# Patient Record
Sex: Female | Born: 1960
Health system: Southern US, Community
[De-identification: ages and names within clinical notes are randomized; demographics above are authoritative.]

## PROBLEM LIST (undated history)

## (undated) DIAGNOSIS — K5790 Diverticulosis of intestine, part unspecified, without perforation or abscess without bleeding: Secondary | ICD-10-CM

## (undated) DIAGNOSIS — M1990 Unspecified osteoarthritis, unspecified site: Secondary | ICD-10-CM

## (undated) DIAGNOSIS — K635 Polyp of colon: Secondary | ICD-10-CM

## (undated) DIAGNOSIS — G44009 Cluster headache syndrome, unspecified, not intractable: Secondary | ICD-10-CM

## (undated) DIAGNOSIS — K219 Gastro-esophageal reflux disease without esophagitis: Secondary | ICD-10-CM

## (undated) DIAGNOSIS — N920 Excessive and frequent menstruation with regular cycle: Secondary | ICD-10-CM

## (undated) DIAGNOSIS — E282 Polycystic ovarian syndrome: Secondary | ICD-10-CM

## (undated) DIAGNOSIS — I700 Atherosclerosis of aorta: Secondary | ICD-10-CM

## (undated) DIAGNOSIS — Z8619 Personal history of other infectious and parasitic diseases: Secondary | ICD-10-CM

## (undated) DIAGNOSIS — G4730 Sleep apnea, unspecified: Secondary | ICD-10-CM

## (undated) DIAGNOSIS — I10 Essential (primary) hypertension: Secondary | ICD-10-CM

## (undated) DIAGNOSIS — T7840XA Allergy, unspecified, initial encounter: Secondary | ICD-10-CM

## (undated) DIAGNOSIS — F419 Anxiety disorder, unspecified: Secondary | ICD-10-CM

## (undated) DIAGNOSIS — M25561 Pain in right knee: Secondary | ICD-10-CM

## (undated) DIAGNOSIS — Z87442 Personal history of urinary calculi: Secondary | ICD-10-CM

## (undated) HISTORY — PX: CHOLECYSTECTOMY: SHX55

## (undated) HISTORY — PX: CYSTOSCOPY: SUR368

## (undated) HISTORY — DX: Pain in right knee: M25.561

## (undated) HISTORY — DX: Personal history of other infectious and parasitic diseases: Z86.19

## (undated) HISTORY — DX: Essential (primary) hypertension: I10

## (undated) HISTORY — PX: COLON SURGERY: SHX602

## (undated) HISTORY — DX: Diverticulosis of intestine, part unspecified, without perforation or abscess without bleeding: K57.90

## (undated) HISTORY — DX: Cluster headache syndrome, unspecified, not intractable: G44.009

## (undated) HISTORY — DX: Gastro-esophageal reflux disease without esophagitis: K21.9

## (undated) HISTORY — DX: Excessive and frequent menstruation with regular cycle: N92.0

## (undated) HISTORY — DX: Polyp of colon: K63.5

## (undated) HISTORY — DX: Atherosclerosis of aorta: I70.0

## (undated) HISTORY — DX: Allergy, unspecified, initial encounter: T78.40XA

## (undated) HISTORY — DX: Polycystic ovarian syndrome: E28.2

---

## 1991-03-09 HISTORY — PX: TUBAL LIGATION: SHX77

## 1999-06-10 ENCOUNTER — Other Ambulatory Visit: Admission: RE | Admit: 1999-06-10 | Discharge: 1999-06-10 | Payer: Self-pay | Admitting: Obstetrics and Gynecology

## 1999-06-23 ENCOUNTER — Encounter: Payer: Self-pay | Admitting: Internal Medicine

## 1999-06-23 ENCOUNTER — Ambulatory Visit (HOSPITAL_COMMUNITY): Admission: RE | Admit: 1999-06-23 | Discharge: 1999-06-23 | Payer: Self-pay | Admitting: Internal Medicine

## 1999-08-03 ENCOUNTER — Encounter: Payer: Self-pay | Admitting: Internal Medicine

## 1999-08-03 ENCOUNTER — Ambulatory Visit (HOSPITAL_COMMUNITY): Admission: RE | Admit: 1999-08-03 | Discharge: 1999-08-03 | Payer: Self-pay | Admitting: Internal Medicine

## 2000-03-31 ENCOUNTER — Other Ambulatory Visit: Admission: RE | Admit: 2000-03-31 | Discharge: 2000-03-31 | Payer: Self-pay | Admitting: Obstetrics and Gynecology

## 2000-06-21 ENCOUNTER — Ambulatory Visit (HOSPITAL_BASED_OUTPATIENT_CLINIC_OR_DEPARTMENT_OTHER): Admission: RE | Admit: 2000-06-21 | Discharge: 2000-06-21 | Payer: Self-pay | Admitting: Internal Medicine

## 2000-06-28 ENCOUNTER — Other Ambulatory Visit: Admission: RE | Admit: 2000-06-28 | Discharge: 2000-06-28 | Payer: Self-pay | Admitting: Obstetrics and Gynecology

## 2000-06-28 ENCOUNTER — Encounter (INDEPENDENT_AMBULATORY_CARE_PROVIDER_SITE_OTHER): Payer: Self-pay | Admitting: Specialist

## 2001-01-30 ENCOUNTER — Encounter: Payer: Self-pay | Admitting: Emergency Medicine

## 2001-01-30 ENCOUNTER — Emergency Department (HOSPITAL_COMMUNITY): Admission: EM | Admit: 2001-01-30 | Discharge: 2001-01-30 | Payer: Self-pay | Admitting: Emergency Medicine

## 2001-07-18 ENCOUNTER — Emergency Department (HOSPITAL_COMMUNITY): Admission: EM | Admit: 2001-07-18 | Discharge: 2001-07-18 | Payer: Self-pay | Admitting: Emergency Medicine

## 2001-07-18 ENCOUNTER — Encounter: Payer: Self-pay | Admitting: Emergency Medicine

## 2001-08-31 ENCOUNTER — Emergency Department (HOSPITAL_COMMUNITY): Admission: EM | Admit: 2001-08-31 | Discharge: 2001-08-31 | Payer: Self-pay | Admitting: Emergency Medicine

## 2003-05-01 ENCOUNTER — Emergency Department (HOSPITAL_COMMUNITY): Admission: EM | Admit: 2003-05-01 | Discharge: 2003-05-01 | Payer: Self-pay | Admitting: Emergency Medicine

## 2003-05-03 ENCOUNTER — Ambulatory Visit (HOSPITAL_COMMUNITY): Admission: RE | Admit: 2003-05-03 | Discharge: 2003-05-03 | Payer: Self-pay | Admitting: Emergency Medicine

## 2003-06-14 ENCOUNTER — Emergency Department (HOSPITAL_COMMUNITY): Admission: EM | Admit: 2003-06-14 | Discharge: 2003-06-14 | Payer: Self-pay | Admitting: Emergency Medicine

## 2003-10-25 ENCOUNTER — Encounter: Admission: RE | Admit: 2003-10-25 | Discharge: 2003-10-25 | Payer: Self-pay | Admitting: Internal Medicine

## 2004-04-23 ENCOUNTER — Ambulatory Visit (HOSPITAL_COMMUNITY): Admission: RE | Admit: 2004-04-23 | Discharge: 2004-04-23 | Payer: Self-pay | Admitting: Internal Medicine

## 2004-07-31 ENCOUNTER — Ambulatory Visit (HOSPITAL_COMMUNITY): Admission: RE | Admit: 2004-07-31 | Discharge: 2004-07-31 | Payer: Self-pay | Admitting: Gastroenterology

## 2004-08-04 ENCOUNTER — Ambulatory Visit (HOSPITAL_COMMUNITY): Admission: RE | Admit: 2004-08-04 | Discharge: 2004-08-04 | Payer: Self-pay | Admitting: Gastroenterology

## 2005-04-30 ENCOUNTER — Ambulatory Visit (HOSPITAL_COMMUNITY): Admission: RE | Admit: 2005-04-30 | Discharge: 2005-04-30 | Payer: Self-pay | Admitting: Obstetrics & Gynecology

## 2005-06-16 ENCOUNTER — Ambulatory Visit (HOSPITAL_COMMUNITY): Admission: RE | Admit: 2005-06-16 | Discharge: 2005-06-16 | Payer: Self-pay | Admitting: Obstetrics & Gynecology

## 2005-07-20 ENCOUNTER — Encounter: Admission: RE | Admit: 2005-07-20 | Discharge: 2005-07-20 | Payer: Self-pay | Admitting: Internal Medicine

## 2005-07-30 ENCOUNTER — Ambulatory Visit (HOSPITAL_COMMUNITY): Admission: RE | Admit: 2005-07-30 | Discharge: 2005-07-30 | Payer: Self-pay | Admitting: Obstetrics & Gynecology

## 2005-07-30 ENCOUNTER — Encounter (INDEPENDENT_AMBULATORY_CARE_PROVIDER_SITE_OTHER): Payer: Self-pay | Admitting: Specialist

## 2006-03-08 HISTORY — PX: CYSTECTOMY: SUR359

## 2008-04-01 ENCOUNTER — Inpatient Hospital Stay (HOSPITAL_COMMUNITY): Admission: EM | Admit: 2008-04-01 | Discharge: 2008-04-08 | Payer: Self-pay | Admitting: Emergency Medicine

## 2008-04-18 ENCOUNTER — Encounter: Admission: RE | Admit: 2008-04-18 | Discharge: 2008-04-18 | Payer: Self-pay | Admitting: Gastroenterology

## 2008-11-12 ENCOUNTER — Encounter: Admission: RE | Admit: 2008-11-12 | Discharge: 2008-11-12 | Payer: Self-pay | Admitting: Gastroenterology

## 2010-03-18 ENCOUNTER — Encounter
Admission: RE | Admit: 2010-03-18 | Discharge: 2010-03-18 | Payer: Self-pay | Source: Home / Self Care | Attending: Gastroenterology | Admitting: Gastroenterology

## 2010-03-29 ENCOUNTER — Encounter: Payer: Self-pay | Admitting: Gastroenterology

## 2010-03-29 ENCOUNTER — Encounter: Payer: Self-pay | Admitting: Internal Medicine

## 2010-05-18 ENCOUNTER — Other Ambulatory Visit (HOSPITAL_COMMUNITY): Payer: Self-pay | Admitting: Obstetrics and Gynecology

## 2010-05-18 DIAGNOSIS — Z1231 Encounter for screening mammogram for malignant neoplasm of breast: Secondary | ICD-10-CM

## 2010-05-21 ENCOUNTER — Ambulatory Visit (HOSPITAL_COMMUNITY): Payer: 59 | Attending: Obstetrics and Gynecology

## 2010-06-22 LAB — CBC
HCT: 35.4 % — ABNORMAL LOW (ref 36.0–46.0)
HCT: 35.9 % — ABNORMAL LOW (ref 36.0–46.0)
HCT: 39 % (ref 36.0–46.0)
HCT: 46.8 % — ABNORMAL HIGH (ref 36.0–46.0)
Hemoglobin: 11.7 g/dL — ABNORMAL LOW (ref 12.0–15.0)
Hemoglobin: 12 g/dL (ref 12.0–15.0)
Hemoglobin: 12 g/dL (ref 12.0–15.0)
Hemoglobin: 15.7 g/dL — ABNORMAL HIGH (ref 12.0–15.0)
MCHC: 32.8 g/dL (ref 30.0–36.0)
MCHC: 33.3 g/dL (ref 30.0–36.0)
MCV: 87.6 fL (ref 78.0–100.0)
MCV: 88.6 fL (ref 78.0–100.0)
Platelets: 387 10*3/uL (ref 150–400)
RBC: 4.09 MIL/uL (ref 3.87–5.11)
RBC: 5.39 MIL/uL — ABNORMAL HIGH (ref 3.87–5.11)
RDW: 12.5 % (ref 11.5–15.5)
RDW: 12.6 % (ref 11.5–15.5)
RDW: 12.7 % (ref 11.5–15.5)
RDW: 12.8 % (ref 11.5–15.5)
RDW: 12.9 % (ref 11.5–15.5)
WBC: 4.5 10*3/uL (ref 4.0–10.5)
WBC: 6.8 10*3/uL (ref 4.0–10.5)

## 2010-06-22 LAB — BASIC METABOLIC PANEL
BUN: 2 mg/dL — ABNORMAL LOW (ref 6–23)
CO2: 24 mEq/L (ref 19–32)
Chloride: 107 mEq/L (ref 96–112)
Chloride: 111 mEq/L (ref 96–112)
Creatinine, Ser: 0.94 mg/dL (ref 0.4–1.2)
GFR calc non Af Amer: >60 mL/min (ref >60)
GFR calc non Af Amer: >60 mL/min (ref >60)
Glucose, Bld: 107 mg/dL — ABNORMAL HIGH (ref 70–99)
Glucose, Bld: 107 mg/dL — ABNORMAL HIGH (ref 70–99)
Glucose, Bld: 112 mg/dL — ABNORMAL HIGH (ref 70–99)
Potassium: 3.6 mEq/L (ref 3.5–5.1)
Potassium: 4 mEq/L (ref 3.5–5.1)
Potassium: 4 mEq/L (ref 3.5–5.1)
Sodium: 137 mEq/L (ref 135–145)
Sodium: 140 mEq/L (ref 135–145)

## 2010-06-22 LAB — COMPREHENSIVE METABOLIC PANEL
ALT: 15 U/L (ref 0–35)
ALT: 16 U/L (ref 0–35)
AST: 10 U/L (ref 0–37)
Alkaline Phosphatase: 36 U/L — ABNORMAL LOW (ref 39–117)
Alkaline Phosphatase: 49 U/L (ref 39–117)
BUN: 7 mg/dL (ref 6–23)
CO2: 25 mEq/L (ref 19–32)
Calcium: 8.4 mg/dL (ref 8.4–10.5)
Chloride: 99 mEq/L (ref 96–112)
GFR calc Af Amer: >60 mL/min (ref >60)
Glucose, Bld: 104 mg/dL — ABNORMAL HIGH (ref 70–99)
Potassium: 2.6 mEq/L — CL (ref 3.5–5.1)
Potassium: 3 mEq/L — ABNORMAL LOW (ref 3.5–5.1)
Sodium: 136 mEq/L (ref 135–145)
Sodium: 136 mEq/L (ref 135–145)
Total Bilirubin: 1.1 mg/dL (ref 0.3–1.2)
Total Protein: 5.9 g/dL — ABNORMAL LOW (ref 6.0–8.3)
Total Protein: 7.4 g/dL (ref 6.0–8.3)

## 2010-06-22 LAB — URINALYSIS, ROUTINE W REFLEX MICROSCOPIC
Glucose, UA: NEGATIVE mg/dL
Ketones, ur: 15 mg/dL — AB
Protein, ur: NEGATIVE mg/dL
Urobilinogen, UA: 0.2 mg/dL (ref 0.0–1.0)

## 2010-06-22 LAB — CLOSTRIDIUM DIFFICILE EIA
C difficile Toxins A+B, EIA: NEGATIVE
C difficile Toxins A+B, EIA: NEGATIVE
C difficile Toxins A+B, EIA: NEGATIVE

## 2010-06-22 LAB — POCT PREGNANCY, URINE: Preg Test, Ur: NEGATIVE

## 2010-06-22 LAB — DIFFERENTIAL
Basophils Absolute: 0 10*3/uL (ref 0.0–0.1)
Basophils Relative: 0 % (ref 0–1)
Eosinophils Absolute: 0 10*3/uL (ref 0.0–0.7)
Monocytes Absolute: 0.5 10*3/uL (ref 0.1–1.0)
Monocytes Relative: 7 % (ref 3–12)
Neutrophils Relative %: 81 % — ABNORMAL HIGH (ref 43–77)

## 2010-06-22 LAB — URINE MICROSCOPIC-ADD ON

## 2010-07-21 NOTE — Discharge Summary (Signed)
[name], [name]:  [id number]   MEDICAL RECORD NO.:  [id number]          PATIENT TYPE:  INP   LOCATION:  1534                         FACILITY:  [facility]   PHYSICIAN:  Fleet Contras, M.D.    DATE OF BIRTH:  02/19/1961   DATE OF ADMISSION:  04/01/2008  DATE OF DISCHARGE:  04/08/2008                               DISCHARGE SUMMARY   HISTORY OF PRESENT ILLNESS:  Kelsey Ortega is a 50 year old African  American lady with past medical history significant for systemic  hypertension, gastroesophageal reflux disease, and allergic rhinitis.  She came to the emergency room at [facility] with 4- to 5-day  history of severe crampy lower abdominal pain associated with  constipation and vomiting with nausea.  She stated her symptoms started  after she ate 2 hot dogs and her bowel movements ceased and then her  abdominal pain progressively got worse despite using castor oil for the  constipation.  She was seen at an urgent care center and treated with  ciprofloxacin for presumed urinary tract infection but her symptoms did  not improve and decided to come to the emergency room.  At the ED, her  vital signs were stable.  She was mildly dehydrated.  Initial laboratory  data showed a low potassium.  CT scan of the abdomen and pelvis showed  evidence of partial small bowel obstruction secondary to sigmoid  diverticulitis.  She was therefore admitted to the hospital for  intravenous antibiotic therapy, intravenous fluids, and potassium  repletion.   HOSPITAL COURSE:  On admission, patient was started on intravenous  fluids with D5 normal saline at 100 mL an hour.  Potassium was repleted  intravenously, as well as orally.  She was kept n.p.o. until her nausea  and vomiting subsided.  She was advanced to clear liquids and then to  regular diet which she has been tolerating.  Followup x-rays of the  abdomen have been done serially and showed improvement in the bowel  obstruction,.  A gastroenterology consult was requested and patient was  kindly seen by Dr. Bosie Clos who recommended continuing the appropriate  treatment patient currently receiving.  She was on intravenous Flagyl  and ciprofloxacin as SHE IS ALLERGIC TO PENICILLIN.  She has continued  to make progress and today she is feeling much better.  She is able to  tolerate full diet.  Her diarrhea has subsided.  Clostridium difficile  stool studies were negative x3.  She is able to tolerate full diet.  She  is ambulating.  Abdominal pain has almost resolved completely and she is  eager to be discharged home.  Today she is lying comfortably in bed, not  in acute respiratory or painful distress.  Her blood pressure is 106/67.  Heart rate is 87.  Respiratory rate of 16.  Temperature 98.2.  O2  saturations on room air are 96%.  She is not pale.  She is not icteric.  She is well hydrated.  Chest shows good air entry bilaterally.  Abdomen  is obese, soft, nontender, no masses.  Bowel sounds are present.  Extremities show no edema, calf  tenderness, or swelling.  CNS:  She is  alert and oriented x3 with no focal neurological deficits.   DISCHARGE LABORATORY DATA:  On April 05, 2008, shows a white count of  4.5, hemoglobin 12, hematocrit 35.9, platelet count of 365.  Sodium was  137, potassium 3.6, chloride 106, bicarbonate of 24, BUN was 2,  creatinine 0.89, and glucose of 107.  Lipase was 22 on admission.  LFTs  were within normal limits on admission.  Clostridium difficile x3 were  negative.  Pregnancy test was negative.  Recent abdominal x-ray on  April 07, 2008, showed improved small bowel obstruction.  She is now  considered stable for discharge home.   DISCHARGE DIAGNOSES:  1. Partial small bowel obstruction.  2. Acute sigmoid diverticulitis.  3. Hypokalemia, repleted.  4. Dehydrated.   CONDITION ON DISCHARGE:  Sable.   DISPOSITION:  Is for home.   DISCHARGE MEDICATIONS:  Will be:  1.  Zofran 4 mg every 6 hours p.r.n. for nausea and vomiting.  2. Vicodin 5/500 one p.o. q.6 p.r.n. for pain.  3. Protonix 40 mg p.o. daily.  4. Toprol XL 25 mg p.o. daily.  5. Maxzide will be put on hold as her blood pressure has been stable      without aids throughout hospitalization.  6. Flagyl 500 mg p.o. t.i.d. for 3 more days.  7. Cipro 100 mg p.o. b.i.d. for 3 more days.   This plan of care has been discussed with her and her questions  answered.  She will follow up with me in the office in 1 week.      Fleet Contras, M.D.  Electronically Signed     EA/MEDQ  D:  04/08/2008  T:  04/08/2008  Job:  [id number]

## 2010-07-21 NOTE — Consult Note (Signed)
NAMEPRESTINA, RAIGOZA NO.:  [id number]   MEDICAL RECORD NO.:  [id number]          PATIENT TYPE:  INP   LOCATION:  1341                         FACILITY:  [facility]   PHYSICIAN:  Shirley Friar, MDDATE OF BIRTH:  [birth year]   DATE OF CONSULTATION:  04/04/2008  DATE OF DISCHARGE:                                 CONSULTATION   We were asked to see Ms. Huyett today in consultation for  diverticulitis and small bowel obstruction by Fleet Contras, M.D.   HISTORY OF PRESENT ILLNESS:  This is a very pleasant 50 year old female  with a history of diverticulosis and colon polyps on colonoscopy in  2008.  She reports abdominal pain that started last Thursday evening  after eating two hot dogs.  Her bowel movements ceased and her abdominal  pain continued to increase despite taking some Castor oil.  She visited  the urgent care on Saturday who prescribed Cipro for presumed urinary  tract infection.  Her pain continued to increase and she still had no  bowel movement. Monday morning she came to the emergency room at [facility]  A CT scan showed small bowel obstruction with loops of  affected small bowel near sigmoid diverticulitis.  Today the patient has  had three diarrhea bowel movements since last evening.  Her pain is  decreased and she is seeing improvement in her condition.   PAST MEDICAL HISTORY:  Is significant for a D and C and hysteroscopy  which had benign pathology.  She had a colonoscopy in October 2008  because she is high risk.  She has an aunt with colon cancer.  On  colonoscopy she had sigmoid diverticulosis and she had three benign  colon polyps in her rectum.   CURRENT OUTPATIENT MEDICATIONS:  Include Toprol, Maxzide and Cipro.   CURRENT ALLERGIES:  ARE PENICILLIN AND LATEX.   REVIEW OF SYSTEMS:  Is negative other than per HPI.   SOCIAL HISTORY:  Is positive for occasional alcohol, negative for  tobacco.  She works  at [employer].   FAMILY HISTORY:  Is positive for an aunt with colon cancer.  She has no  inflammatory bowel disease in the family.   PHYSICAL EXAM:  She is alert and oriented, in no apparent distress.  She  is very pleasant to speak with.  Temperature is 97.7, pulse 79,  respirations 16, blood pressure is 125/86.  ABDOMEN:  Is obese but soft, nondistended.  She is tender to palpation  in the left lower quadrant.   LABORATORIES:  She is C. diff negative x1.  Potassium is 4.0.  It was  2.6 the day after admission but has since been corrected.  BUN is 2,  creatinine 0.89, white count 3.7, hemoglobin 11.7, hematocrit 35.4,  platelets 343,000.  Lipase is 22.  On January 25 she had a CT of her  abdomen and pelvis that showed:  1. Proximal small-bowel dilated with air-fluid levels consistent with      small bowel obstruction.  Level of obstruction appears to be near      diverticulitis.  2. Sigmoid colon thickening with edema in  the pericolonic fat.  3. They recommended close followup to exclude any developing abscess.      On January 26 she had an abdominal x-ray that showed persistent      small bowel obstruction.   ASSESSMENT:  Dr. Charlott Rakes has seen and examined the patient,  collected a history and reviewed her chart.  His impression is this is a  49 year old female with diverticulitis which caused small bowel  obstruction.   PLAN:  Agree with Dr. Albertina Parr course of care.  Would recommend that  the patient finish her 14-day course of Cipro and Flagyl.  If her  symptoms worsen again, would recommend CT scan to rule out abscess.  Otherwise we will schedule outpatient followup with [facility].  Thank you very much for this consultation.  We will  follow with you.      Stephani Police, [place]      Shirley Friar, MD  Electronically Signed    MLY/MEDQ  D:  04/04/2008  T:  04/04/2008  Job:  [id number]   cc:   Fleet Contras, M.D.  Fax: [phone]

## 2010-07-24 NOTE — Op Note (Signed)
NAMESAARA, KIJOWSKI NO.:  [id number]   MEDICAL RECORD NO.:  [id number]          PATIENT TYPE:  AMB   LOCATION:  SDC                           FACILITY:  [facility]   PHYSICIAN:  Roseanna Rainbow, M.D.DATE OF BIRTH:  [birth year]   DATE OF PROCEDURE:  07/30/2005  DATE OF DISCHARGE:                                 OPERATIVE REPORT   PREOPERATIVE DIAGNOSES:  1.  Abnormal uterine bleeding.  2.  Rule out endometrial polyp.  3.  Rule out synechiae.   POSTOPERATIVE DIAGNOSES:  1.  Abnormal uterine bleeding.  2.  Rule out endometrial polyp.  3.  Rule out synechiae.   PROCEDURE:  Dilatation and curettage, hysteroscopy.   SURGEON:  Roseanna Rainbow, M.D.   ANESTHESIA:  Managed anesthesia care, paracervical block.   PATHOLOGY:  Endometrial curettings.   ESTIMATED BLOOD LOSS:  Minimal.   COMPLICATIONS:  None.   IV FLUID:  As per anesthesiology.   URINE OUTPUT:  100 mL clear urine at the beginning of the procedure.   FINDINGS:  Upon hysteroscopic survey, there was a synechia noted emanating  from the right uterine sidewall near the uterine fundus.  The endometrium  had a polypoid appearance which was diffuse in nature.   PROCEDURES:  The patient was taken to the operating room with an IV running.  She was placed in a dorsal lithotomy position and prepped and draped in the  usual sterile fashion.  Sims retractors were placed into the vagina.  The  anterior lip of the cervix was infiltrated with 2 mL of 1% lidocaine.  A  single-tooth tenaculum was applied to this location.  Lidocaine 1% 4 mL were  injected at 5 and 7 o'clock to produce a paracervical block.  The cervix was  then dilated with [facility] dilators.  A diagnostic hysteroscope was then  advanced into the uterus.  The above findings were noted.  The uterus  sounded to 9 cm.  Sharp curettage was performed.  The tenaculum was then  removed with  minimal bleeding noted from the cervix.  At the close of  the procedure the  instrument and pack counts were said to be correct x2.  The patient was  taken to the PACU awake and in stable condition.   PATHOLOGY:  Endometrial curettings.      Roseanna Rainbow, M.D.  Electronically Signed     LAJ/MEDQ  D:  07/30/2005  T:  07/31/2005  Job:  [id number]

## 2010-07-28 ENCOUNTER — Other Ambulatory Visit (HOSPITAL_COMMUNITY): Payer: Self-pay | Admitting: Obstetrics and Gynecology

## 2010-07-28 ENCOUNTER — Other Ambulatory Visit (HOSPITAL_COMMUNITY): Payer: Self-pay | Admitting: Internal Medicine

## 2010-07-28 DIAGNOSIS — Z1231 Encounter for screening mammogram for malignant neoplasm of breast: Secondary | ICD-10-CM

## 2010-07-29 ENCOUNTER — Ambulatory Visit (HOSPITAL_COMMUNITY)
Admission: RE | Admit: 2010-07-29 | Discharge: 2010-07-29 | Disposition: A | Discharge status: Home / Self Care | Payer: 59 | Source: Ambulatory Visit | Attending: Internal Medicine | Admitting: Internal Medicine

## 2010-07-29 DIAGNOSIS — Z1231 Encounter for screening mammogram for malignant neoplasm of breast: Secondary | ICD-10-CM | POA: Insufficient documentation

## 2012-08-29 ENCOUNTER — Other Ambulatory Visit: Payer: Self-pay | Admitting: Internal Medicine

## 2012-08-29 ENCOUNTER — Other Ambulatory Visit (HOSPITAL_COMMUNITY)
Admission: RE | Admit: 2012-08-29 | Discharge: 2012-08-29 | Disposition: A | Discharge status: Home / Self Care | Payer: BC Managed Care – PPO | Source: Ambulatory Visit | Attending: Internal Medicine | Admitting: Internal Medicine

## 2012-08-29 DIAGNOSIS — Z01419 Encounter for gynecological examination (general) (routine) without abnormal findings: Secondary | ICD-10-CM | POA: Insufficient documentation

## 2012-08-30 ENCOUNTER — Encounter: Payer: Self-pay | Admitting: Internal Medicine

## 2012-08-30 ENCOUNTER — Encounter: Payer: Self-pay | Admitting: Gastroenterology

## 2012-09-05 ENCOUNTER — Ambulatory Visit (INDEPENDENT_AMBULATORY_CARE_PROVIDER_SITE_OTHER): Payer: Self-pay | Admitting: Surgery

## 2012-09-06 ENCOUNTER — Encounter (INDEPENDENT_AMBULATORY_CARE_PROVIDER_SITE_OTHER): Payer: Self-pay

## 2012-09-07 ENCOUNTER — Encounter (INDEPENDENT_AMBULATORY_CARE_PROVIDER_SITE_OTHER): Payer: Self-pay

## 2012-09-11 ENCOUNTER — Ambulatory Visit: Payer: 59 | Admitting: Internal Medicine

## 2012-09-15 ENCOUNTER — Ambulatory Visit (INDEPENDENT_AMBULATORY_CARE_PROVIDER_SITE_OTHER): Payer: 59 | Admitting: Surgery

## 2012-09-19 ENCOUNTER — Encounter: Payer: Self-pay | Admitting: *Deleted

## 2012-09-28 ENCOUNTER — Ambulatory Visit: Payer: 59 | Admitting: Gastroenterology

## 2012-10-02 ENCOUNTER — Ambulatory Visit: Payer: 59 | Admitting: Gastroenterology

## 2012-10-02 ENCOUNTER — Ambulatory Visit (INDEPENDENT_AMBULATORY_CARE_PROVIDER_SITE_OTHER): Payer: BC Managed Care – PPO | Admitting: Surgery

## 2012-10-02 ENCOUNTER — Encounter (INDEPENDENT_AMBULATORY_CARE_PROVIDER_SITE_OTHER): Payer: Self-pay | Admitting: Surgery

## 2012-10-02 VITALS — BP 124/86 | HR 72 | Temp 97.7°F | Resp 14 | Ht 69.0 in | Wt 244.8 lb

## 2012-10-02 DIAGNOSIS — K8020 Calculus of gallbladder without cholecystitis without obstruction: Secondary | ICD-10-CM

## 2012-10-02 NOTE — Patient Instructions (Signed)

## 2012-10-02 NOTE — Progress Notes (Signed)
Patient ID: Kelsey Ortega, female   DOB: [birth year], 52 y.o.   MRN: [id number]  No chief complaint on file.   HPI Kelsey Ortega is a 52 y.o. female.  Patient sent at the request of Dr. Selena Batten for gallstones. Patient has history of known gallstone disease dating back 20 years. She is having epigastric pain after fatty meals. The pain radiates to right upper quadrant. Radiates into her back chest. It gives way on its own after minutes to hours. Fatty  Meals make it worse. HPI  Past Medical History  Diagnosis Date  . Hypertension   . PCOS (polycystic ovarian syndrome)   . Menorrhagia   . Headaches, cluster   . Right knee pain     Past Surgical History  Procedure Laterality Date  . Tubal ligation  1993  . Cystectomy  2008    Family History  Problem Relation Age of Onset  . Hypertension Maternal Grandmother   . Hypertension Brother   . Breast cancer    . Breast cancer      Social History History  Substance Use Topics  . Smoking status: Never Smoker   . Smokeless tobacco: Never Used  . Alcohol Use: Not on file    Allergies  Allergen Reactions  . Augmentin (Amoxicillin-Pot Clavulanate) Diarrhea  . Biaxin (Clarithromycin) Itching  . Limbitrol Ds (Chlordiazepoxide-Amitriptyline) Other (See Comments)    groggy  . Sudafed (Pseudoephedrine Hcl) Palpitations    Current Outpatient Prescriptions  Medication Sig Dispense Refill  . diazepam (VALIUM) 10 MG tablet Take 10 mg by mouth every 6 (six) hours as needed for anxiety.      . metFORMIN (GLUCOPHAGE) 500 MG tablet Take 500 mg by mouth 2 (two) times daily with a meal.      . metoprolol succinate (TOPROL-XL) 50 MG 24 hr tablet Take 50 mg by mouth daily. Take with or immediately following a meal.      . triamterene-hydrochlorothiazide (DYAZIDE) 37.5-25 MG per capsule Take 1 capsule by mouth every morning.      . topiramate (TOPAMAX) 25 MG tablet Take 25 mg by mouth 2 (two) times daily.       No current  facility-administered medications for this visit.    Review of Systems Review of Systems  Constitutional: Negative for fever, chills and unexpected weight change.  HENT: Negative for hearing loss, congestion, sore throat, trouble swallowing and voice change.   Eyes: Negative for visual disturbance.  Respiratory: Negative for cough and wheezing.   Cardiovascular: Negative for chest pain, palpitations and leg swelling.  Gastrointestinal: Positive for constipation and rectal pain. Negative for nausea, vomiting, abdominal pain, diarrhea, blood in stool, abdominal distention and anal bleeding.  Genitourinary: Negative for hematuria, vaginal bleeding and difficulty urinating.  Musculoskeletal: Negative for arthralgias.  Skin: Negative for rash and wound.  Neurological: Negative for seizures, syncope and headaches.  Hematological: Negative for adenopathy. Does not bruise/bleed easily.  Psychiatric/Behavioral: Negative for confusion.    Blood pressure 124/86, pulse 72, temperature 97.7 F (36.5 C), resp. rate 14, height 5\' 9"  (1.753 m), weight 244 lb 12.8 oz (111.041 kg).  Physical Exam Physical Exam  Constitutional: She is oriented to person, place, and time. She appears well-developed and well-nourished.  HENT:  Head: Normocephalic and atraumatic.  Eyes: EOM are normal. Pupils are equal, round, and reactive to light. No scleral icterus.  Neck: Normal range of motion. Neck supple.  Cardiovascular: Normal rate and regular rhythm.   Pulmonary/Chest: Effort normal and breath sounds normal.  Abdominal: Soft. Bowel sounds are normal. There is no tenderness. There is no rebound and no guarding.  Musculoskeletal: Normal range of motion.  Neurological: She is alert and oriented to person, place, and time.  Skin: Skin is warm and dry.  Psychiatric: She has a normal mood and affect. Her behavior is normal. Judgment and thought content normal.    Data Reviewed Gallbladder: Single mobile  gallstone measuring 1.4 cm noted within  the gallbladder. Wall is borderline in thickness at 3 mm.  Negative sonographic Murphy's.  Common bile duct: Normal caliber, 3 mm.  Liver: Mildly heterogeneous and increased echotexture throughout  the liver suggest possibility of mild fatty infiltration. No focal  lesion or biliary ductal dilatation.  IVC: Appears normal.  Pancreas: No focal abnormality seen.  Spleen: Within normal limits in size and echotexture.  Right Kidney: Dilated upper pole calyces are again noted and are  stable since prior CT. No focal lesion. Right kidney measures  12.7 cm.  Left Kidney: 11.7 cm. Normal size and echotexture. No focal  abnormality. No hydronephrosis.  Abdominal aorta: No aneurysm identified.  IMPRESSION:  Cholelithiasis. Borderline gallbladder wall thickness may reflect  mild chronic cholecystitis. Negative sonographic Murphy's sign.  Dense, heterogeneous echotexture throughout the liver suggests  fatty infiltration.   Assessment    Symptomatic cholelithiasis    Plan    Discussed options of treatment of symptomatic cholelithiasis. Pathophysiology discussed. Risks, benefits and alternatives to surgery discussed. Recommend laparoscopic cholecystectomy and cholangiogram.The procedure has been discussed with the patient. Operative and non operative treatments have been discussed. Risks of surgery include bleeding, infection,  Common bile duct injury,  Injury to the stomach,liver, colon,small intestine, abdominal wall,  Diaphragm,  Major blood vessels,  And the need for an open procedure.  Other risks include worsening of medical problems, death,  DVT and pulmonary embolism, and cardiovascular events.   Medical options have also been discussed. The patient has been informed of long term expectations of surgery and non surgical options,  The patient agrees to proceed.         Demarie Hyneman A. 10/02/2012, 10:35 AM

## 2012-10-06 ENCOUNTER — Telehealth (INDEPENDENT_AMBULATORY_CARE_PROVIDER_SITE_OTHER): Payer: Self-pay | Admitting: Surgery

## 2012-10-06 NOTE — Telephone Encounter (Signed)
Patient met with surgery scheduling went over financial responsibilies, patient will call back to schedule, orders holding in pending chart in surgery scheduling

## 2012-12-05 ENCOUNTER — Encounter (INDEPENDENT_AMBULATORY_CARE_PROVIDER_SITE_OTHER): Payer: Self-pay

## 2013-01-24 ENCOUNTER — Ambulatory Visit (INDEPENDENT_AMBULATORY_CARE_PROVIDER_SITE_OTHER): Payer: BC Managed Care – PPO | Admitting: Surgery

## 2013-01-24 ENCOUNTER — Encounter (INDEPENDENT_AMBULATORY_CARE_PROVIDER_SITE_OTHER): Payer: Self-pay | Admitting: Surgery

## 2013-01-24 ENCOUNTER — Encounter (INDEPENDENT_AMBULATORY_CARE_PROVIDER_SITE_OTHER): Payer: Self-pay

## 2013-01-24 VITALS — BP 132/86 | HR 88 | Temp 98.8°F | Resp 16 | Ht 69.0 in | Wt 261.0 lb

## 2013-01-24 DIAGNOSIS — K8020 Calculus of gallbladder without cholecystitis without obstruction: Secondary | ICD-10-CM

## 2013-01-24 NOTE — Progress Notes (Signed)
Patient ID: Kelsey Ortega, female   DOB: [birth year], 52 y.o.   MRN: [id number]  No chief complaint on file.   HPI Kelsey Ortega is a 52 y.o. female.  Patient sent at the request of Dr. Selena Batten for gallstones. Patient has history of known gallstone disease dating back 20 years. She is having epigastric pain after fatty meals. The pain radiates to right upper quadrant. Radiates into her back chest. It gives way on its own after minutes to hours. Fatty  Meals make it worse. HPI  Past Medical History  Diagnosis Date  . Hypertension   . PCOS (polycystic ovarian syndrome)   . Menorrhagia   . Headaches, cluster   . Right knee pain     Past Surgical History  Procedure Laterality Date  . Tubal ligation  1993  . Cystectomy  2008    Family History  Problem Relation Age of Onset  . Hypertension Maternal Grandmother   . Hypertension Brother   . Breast cancer    . Breast cancer      Social History History  Substance Use Topics  . Smoking status: Never Smoker   . Smokeless tobacco: Never Used  . Alcohol Use: Not on file    Allergies  Allergen Reactions  . Augmentin [Amoxicillin-Pot Clavulanate] Diarrhea  . Biaxin [Clarithromycin] Itching  . Limbitrol Ds [Chlordiazepoxide-Amitriptyline] Other (See Comments)    groggy  . Sudafed [Pseudoephedrine Hcl] Palpitations    Current Outpatient Prescriptions  Medication Sig Dispense Refill  . diazepam (VALIUM) 10 MG tablet Take 10 mg by mouth every 6 (six) hours as needed for anxiety.      . metFORMIN (GLUCOPHAGE) 500 MG tablet Take 500 mg by mouth 2 (two) times daily with a meal.      . metoprolol succinate (TOPROL-XL) 50 MG 24 hr tablet Take 50 mg by mouth daily. Take with or immediately following a meal.      . topiramate (TOPAMAX) 25 MG tablet Take 25 mg by mouth 2 (two) times daily.      Marland Kitchen triamterene-hydrochlorothiazide (DYAZIDE) 37.5-25 MG per capsule Take 1 capsule by mouth every morning.       No current  facility-administered medications for this visit.    Review of Systems Review of Systems  Constitutional: Negative for fever, chills and unexpected weight change.  HENT: Negative for hearing loss, congestion, sore throat, trouble swallowing and voice change.   Eyes: Negative for visual disturbance.  Respiratory: Negative for cough and wheezing.   Cardiovascular: Negative for chest pain, palpitations and leg swelling.  Gastrointestinal: Positive for constipation and rectal pain. Negative for nausea, vomiting, abdominal pain, diarrhea, blood in stool, abdominal distention and anal bleeding.  Genitourinary: Negative for hematuria, vaginal bleeding and difficulty urinating.  Musculoskeletal: Negative for arthralgias.  Skin: Negative for rash and wound.  Neurological: Negative for seizures, syncope and headaches.  Hematological: Negative for adenopathy. Does not bruise/bleed easily.  Psychiatric/Behavioral: Negative for confusion.    Blood pressure 132/86, pulse 88, temperature 98.8 F (37.1 C), resp. rate 16, height 5\' 9"  (1.753 m), weight 261 lb (118.389 kg).  Physical Exam Physical Exam  Constitutional: She is oriented to person, place, and time. She appears well-developed and well-nourished.  HENT:  Head: Normocephalic and atraumatic.  Eyes: EOM are normal. Pupils are equal, round, and reactive to light. No scleral icterus.  Neck: Normal range of motion. Neck supple.  Cardiovascular: Normal rate and regular rhythm.   Pulmonary/Chest: Effort normal and breath sounds normal.  Abdominal:  Soft. Bowel sounds are normal. There is no tenderness. There is no rebound and no guarding.  Musculoskeletal: Normal range of motion.  Neurological: She is alert and oriented to person, place, and time.  Skin: Skin is warm and dry.  Psychiatric: She has a normal mood and affect. Her behavior is normal. Judgment and thought content normal.    Data Reviewed Gallbladder: Single mobile gallstone  measuring 1.4 cm noted within  the gallbladder. Wall is borderline in thickness at 3 mm.  Negative sonographic Murphy's.  Common bile duct: Normal caliber, 3 mm.  Liver: Mildly heterogeneous and increased echotexture throughout  the liver suggest possibility of mild fatty infiltration. No focal  lesion or biliary ductal dilatation.  IVC: Appears normal.  Pancreas: No focal abnormality seen.  Spleen: Within normal limits in size and echotexture.  Right Kidney: Dilated upper pole calyces are again noted and are  stable since prior CT. No focal lesion. Right kidney measures  12.7 cm.  Left Kidney: 11.7 cm. Normal size and echotexture. No focal  abnormality. No hydronephrosis.  Abdominal aorta: No aneurysm identified.  IMPRESSION:  Cholelithiasis. Borderline gallbladder wall thickness may reflect  mild chronic cholecystitis. Negative sonographic Murphy's sign.  Dense, heterogeneous echotexture throughout the liver suggests  fatty infiltration.   Assessment    Symptomatic cholelithiasis    Plan    Discussed options of treatment of symptomatic cholelithiasis. Pathophysiology discussed. Risks, benefits and alternatives to surgery discussed. Recommend laparoscopic cholecystectomy and cholangiogram.The procedure has been discussed with the patient. Operative and non operative treatments have been discussed. Risks of surgery include bleeding, infection,  Common bile duct injury,  Injury to the stomach,liver, colon,small intestine, abdominal wall,  Diaphragm,  Major blood vessels,  And the need for an open procedure.  Other risks include worsening of medical problems, death,  DVT and pulmonary embolism, and cardiovascular events.   Medical options have also been discussed. The patient has been informed of long term expectations of surgery and non surgical options,  The patient agrees to proceed.         Xaviar Lunn A. 01/24/2013, 10:42 AM

## 2013-01-24 NOTE — Patient Instructions (Signed)

## 2013-01-25 ENCOUNTER — Telehealth (INDEPENDENT_AMBULATORY_CARE_PROVIDER_SITE_OTHER): Payer: Self-pay | Admitting: Surgery

## 2013-01-25 NOTE — Telephone Encounter (Signed)
met with pt and went over benefits and financial responsibility. She will call back when she is ready to schedule. skm

## 2013-02-07 ENCOUNTER — Encounter: Payer: Self-pay | Admitting: Internal Medicine

## 2013-04-17 ENCOUNTER — Encounter: Payer: 59 | Admitting: Internal Medicine

## 2013-06-26 ENCOUNTER — Encounter (INDEPENDENT_AMBULATORY_CARE_PROVIDER_SITE_OTHER): Payer: Self-pay | Admitting: Surgery

## 2013-07-05 ENCOUNTER — Telehealth (INDEPENDENT_AMBULATORY_CARE_PROVIDER_SITE_OTHER): Payer: Self-pay

## 2013-07-05 NOTE — Telephone Encounter (Signed)
Send to ER

## 2013-07-05 NOTE — Telephone Encounter (Signed)
Called pt to let her know that if her pain continues that she needs to be seen in the ER. Pt agrees with POC

## 2013-07-05 NOTE — Telephone Encounter (Signed)
Pt has a hx or recurrent gallstones. Pt was last seen in office on 01/24/13 by Dr Brantley Stage. Pt stated that she has started having extreme pain and rates her pain last night a 10 and that if she would have had a ride to the ER would have went last night. Patient has a appt on 07/20/13 but would like to come in sooner to be seen. Pt offered urg office appt and she said that she would not be able to come into the office until next week due to prior arrangements with her son. Pt states that if pain is not releived she will have to go to the ER if we are not able to get her into the office before 07/20/13. Please advise.

## 2013-07-20 ENCOUNTER — Encounter (INDEPENDENT_AMBULATORY_CARE_PROVIDER_SITE_OTHER): Payer: Self-pay | Admitting: Surgery

## 2013-07-27 DIAGNOSIS — I10 Essential (primary) hypertension: Secondary | ICD-10-CM | POA: Insufficient documentation

## 2013-07-27 DIAGNOSIS — K5792 Diverticulitis of intestine, part unspecified, without perforation or abscess without bleeding: Secondary | ICD-10-CM | POA: Insufficient documentation

## 2014-01-15 ENCOUNTER — Other Ambulatory Visit: Payer: Self-pay | Admitting: Internal Medicine

## 2014-01-15 ENCOUNTER — Other Ambulatory Visit (HOSPITAL_COMMUNITY)
Admission: RE | Admit: 2014-01-15 | Discharge: 2014-01-15 | Disposition: A | Discharge status: Home / Self Care | Payer: No Typology Code available for payment source | Source: Ambulatory Visit | Attending: Internal Medicine | Admitting: Internal Medicine

## 2014-01-15 DIAGNOSIS — Z01419 Encounter for gynecological examination (general) (routine) without abnormal findings: Secondary | ICD-10-CM | POA: Insufficient documentation

## 2014-01-17 LAB — CYTOLOGY - PAP

## 2014-04-15 ENCOUNTER — Ambulatory Visit (INDEPENDENT_AMBULATORY_CARE_PROVIDER_SITE_OTHER): Payer: No Typology Code available for payment source | Admitting: Podiatry

## 2014-04-15 ENCOUNTER — Ambulatory Visit: Payer: No Typology Code available for payment source

## 2014-04-15 ENCOUNTER — Ambulatory Visit (INDEPENDENT_AMBULATORY_CARE_PROVIDER_SITE_OTHER): Payer: No Typology Code available for payment source

## 2014-04-15 VITALS — BP 116/85 | HR 93 | Resp 16

## 2014-04-15 DIAGNOSIS — M79671 Pain in right foot: Secondary | ICD-10-CM

## 2014-04-15 DIAGNOSIS — M79672 Pain in left foot: Secondary | ICD-10-CM

## 2014-04-15 DIAGNOSIS — M7661 Achilles tendinitis, right leg: Secondary | ICD-10-CM

## 2014-04-15 NOTE — Progress Notes (Signed)
   Subjective:    Patient ID: Kelsey Ortega, female    DOB: Jul 04, 1960, 54 y.o.   MRN: [id number]  HPI Pt presents with right achilles tendon pain, ongoing problem with h/o achilles tear. She has been wearing a boot for over a year   Review of Systems  Musculoskeletal: Positive for myalgias, back pain and gait problem.  Psychiatric/Behavioral: The patient is nervous/anxious.        Objective:   Physical Exam        Assessment & Plan:

## 2014-04-17 NOTE — Progress Notes (Addendum)
Subjective:     Patient ID: Kelsey Ortega, female   DOB: [birth year], 54 y.o.   MRN: [id number]  HPI patient presents with chronic pain in the right Achilles tendon that has been going on for over a year. She has been immobilized for approximately one year has had extensive physical therapy and has reduced all activity and has tried ice therapy. States she does not remember specific injury   Review of Systems  All other systems reviewed and are negative.      Objective:   Physical Exam  Constitutional: She is oriented to person, place, and time.  Cardiovascular: Intact distal pulses.   Musculoskeletal: Normal range of motion.  Neurological: She is oriented to person, place, and time.  Skin: Skin is warm.  Nursing note and vitals reviewed.  neurovascular status intact with muscle strength adequate and range of motion subtalar and midtarsal joint within normal limits. Patient is noted to have posterior heel pain right at the insertion of the Achilles tendon into the calcaneus both medial lateral and central portion of the tendon. I saw no indication of tear of the Achilles tendon and it was strong in both dorsi and plantarflexion against pressure. Patient's digits are found to be well perfused and she is well oriented 3     Assessment:     Probable chronic Achilles tendinitis with spur formation posterior aspect right heel at the insertion to the calcaneus    Plan:     Reviewed x-ray an H&P indicating spur formation. We reviewed condition and the length of time that she has utilized extensive conservative care and at this time due to the chronic pain she is an and inability to be active or do any form of walking it would probably be best repaired with spur removal. Patient wants procedure and I'm referring her to Dr. Jacqualyn Posey who took over her care at this time for the procedure due to my schedule not been available for surgical intervention     Addendum by Dr. Celesta Gentile,  DPM I saw and examined Kelsey Ortega. Over the course of the past year she has had chronic Achilles pain for which she has attempted multiple conservative treatments including immobilization, stretching, anti-inflammatories without much resolution of symptoms. She states that she has pain into the right Achilles tendon area after standing for periods of time or with ambulation. She states the area intermittent knee swells and she has discomfort in shoe gear. At this time she is requesting surgical intervention to help decrease her pain and deformity. On clinical exam she was found to have a prominent retrocalcaneal exostosis with tenderness on the distal aspect of the Achilles tendon at the insertion of the calcaneus. There is no defect within the Achilles tendon and the top and test was performed which revealed the Achilles tendon was intact. There is mild edema overlying the insertion into the calcaneus however there is no overlying erythema or increase in warmth. There is tenderness palpation overlying this area on the insertion of the Achilles tendon into the calcaneus. There is no other areas of tenderness to bilateral lower extremities and no other areas of edema, erythema, increase in warmth. MMT 5/5, ROM WNL. No pain with calf compression, swelling, warmth, erythema at this time. Assessment: 54 year old female with prominent retrocalcaneal exostosis and insertional Achilles tendinitis. Plan: -I discussed various treatment options with the patient both conservative and surgical. At this time she states that she has exhausted conservative treatment she would like to proceed with  surgical intervention. Post surgery including Achilles tendon debridement with retrocalcaneal exostectomy with detachment and reattachment of Achilles tendon with use of bone anchors. The postoperative course was discussed the patient which included nonweightbearing for proximally 4 weeks progressing to weightbearing as tolerated  in the cam boot for several weeks before returning to a sneaker. I also discussed the patient that she would need to be on DVT prophylaxis while nonweightbearing. Due to this being her right foot she would not be a little drive until she returned into a regular shoe. All risks were discussed the patient which include, but not limited to, infection, bleeding, pain, swelling, need for further surgery, delayed/nonhealing, painful/ugly scar, over/under correction, DVT/PE, hardware failure, loss of foot/leg. Patient understands the risks and wishes to proceed with surgery. All 3 pages the surgical consent were reviewed with the patient and were signed. I also had a long conversation with the patient as it appears that she has had multiple personal issues within the last year to the side and she was ready to have surgery. She states that although she has had some personal issues of past year but she is ready to have surgery and that she has the support at this time to do so. Surgery will be performed at [facility].

## 2014-04-23 ENCOUNTER — Encounter: Payer: Self-pay | Admitting: Podiatry

## 2014-04-24 ENCOUNTER — Telehealth: Payer: Self-pay | Admitting: *Deleted

## 2014-04-24 NOTE — Telephone Encounter (Signed)
Per Dr.Wagoner, I called the patient to see when she would like to schedule her surgery.  He also wants to know if you have requested the MRI results be sent to him. because he will not be able to proceed with surgery without seeing them.  He said to schedule it out for 2-3 weeks because he needs to get medical clearance from Dr. Maudie Mercury.  "I would like to do it whenever he recommends because I am single and will have to find someone to help me."  He can do it on March 7th.  "Okay, put me down tentatively for that day.  I will call if I have a problem to reschedule.  I have called [facility] twice already to get the MRI results and no one has returned my call.  I don't know what the problem is!  Have you ever experienced this before?  Can you request it, sometime the doctor can get it released to them?"  No, we can't get them without your consent.  You will have to go by and sign a release to have it released to [place].  "Maybe that's what the problem is.  I'll go by there. I have an appointment with Nurse Practitioner's office on this Friday so you shouldn't have a problem getting the medical clearance."

## 2014-05-10 ENCOUNTER — Telehealth: Payer: Self-pay | Admitting: *Deleted

## 2014-05-10 NOTE — Telephone Encounter (Signed)
I have cancelled patients post-op appointment scheduled for Friday March 11.

## 2014-05-10 NOTE — Telephone Encounter (Addendum)
I left a message requesting medical records for last visit.  We want to make sure there's no changes in health.  Please fax to [phone].  I called patient and asked if she had gotten a copy of MRI results.  "No, I was going to go by there today to see if I could get it.  I have called over there several times requesting it.  My girlfriend suggested I go by there and request it.  I got a call from the surgical center and was told I have a $5000 deductible.  I called my insurance company and asked them.  They still have my son down as a dependent, he was supposed to have been taken off.  Anyway, I couldn't get it straightened out.  I can renew it in April.  So, I'm going to cancel for now and will call back in April to reschedule."  Okay, give me a call when you are ready to reschedule.  I called and asked Caren Griffins to cancel surgery for 05/13/2014 due to insurance issues and cost.  I informed Dr. Jacqualyn Posey.

## 2014-05-17 ENCOUNTER — Encounter: Payer: No Typology Code available for payment source | Admitting: Podiatry

## 2014-07-15 ENCOUNTER — Ambulatory Visit: Payer: No Typology Code available for payment source

## 2014-09-11 ENCOUNTER — Ambulatory Visit: Payer: No Typology Code available for payment source

## 2014-10-07 ENCOUNTER — Ambulatory Visit: Payer: No Typology Code available for payment source

## 2015-01-03 ENCOUNTER — Ambulatory Visit: Payer: No Typology Code available for payment source | Attending: Family Medicine

## 2015-01-07 ENCOUNTER — Ambulatory Visit: Payer: No Typology Code available for payment source | Attending: Family Medicine

## 2015-04-03 ENCOUNTER — Ambulatory Visit: Payer: No Typology Code available for payment source | Admitting: Family Medicine

## 2015-05-02 ENCOUNTER — Ambulatory Visit (INDEPENDENT_AMBULATORY_CARE_PROVIDER_SITE_OTHER): Payer: No Typology Code available for payment source | Admitting: Family Medicine

## 2015-05-02 ENCOUNTER — Encounter: Payer: Self-pay | Admitting: Family Medicine

## 2015-05-02 VITALS — BP 145/89 | HR 99 | Temp 97.9°F | Resp 18 | Ht 69.0 in | Wt 259.0 lb

## 2015-05-02 DIAGNOSIS — M792 Neuralgia and neuritis, unspecified: Secondary | ICD-10-CM

## 2015-05-02 DIAGNOSIS — M76821 Posterior tibial tendinitis, right leg: Secondary | ICD-10-CM

## 2015-05-02 DIAGNOSIS — F411 Generalized anxiety disorder: Secondary | ICD-10-CM | POA: Insufficient documentation

## 2015-05-02 DIAGNOSIS — K5732 Diverticulitis of large intestine without perforation or abscess without bleeding: Secondary | ICD-10-CM

## 2015-05-02 DIAGNOSIS — E282 Polycystic ovarian syndrome: Secondary | ICD-10-CM

## 2015-05-02 DIAGNOSIS — I10 Essential (primary) hypertension: Secondary | ICD-10-CM

## 2015-05-02 DIAGNOSIS — E669 Obesity, unspecified: Secondary | ICD-10-CM

## 2015-05-02 LAB — CBC WITH DIFFERENTIAL/PLATELET
BASOS PCT: 0 % (ref 0–1)
Basophils Absolute: 0 10*3/uL (ref 0.0–0.1)
EOS PCT: 2 % (ref 0–5)
Eosinophils Absolute: 0.1 10*3/uL (ref 0.0–0.7)
HEMATOCRIT: 40.6 % (ref 36.0–46.0)
Hemoglobin: 14.2 g/dL (ref 12.0–15.0)
Lymphocytes Relative: 43 % (ref 12–46)
Lymphs Abs: 2 10*3/uL (ref 0.7–4.0)
MCH: 30.2 pg (ref 26.0–34.0)
MCHC: 35 g/dL (ref 30.0–36.0)
MCV: 86.4 fL (ref 78.0–100.0)
MONO ABS: 0.6 10*3/uL (ref 0.1–1.0)
MPV: 8.9 fL (ref 8.6–12.4)
Monocytes Relative: 13 % — ABNORMAL HIGH (ref 3–12)
NEUTROS ABS: 2 10*3/uL (ref 1.7–7.7)
Neutrophils Relative %: 42 % — ABNORMAL LOW (ref 43–77)
Platelets: 377 10*3/uL (ref 150–400)
RBC: 4.7 MIL/uL (ref 3.87–5.11)
RDW: 14.2 % (ref 11.5–15.5)
WBC: 4.7 10*3/uL (ref 4.0–10.5)

## 2015-05-02 LAB — HEMOGLOBIN A1C
Hgb A1c MFr Bld: 5.9 % — ABNORMAL HIGH (ref <5.7)
Mean Plasma Glucose: 123 mg/dL — ABNORMAL HIGH (ref <117)

## 2015-05-02 LAB — COMPLETE METABOLIC PANEL WITH GFR
ALBUMIN: 4.2 g/dL (ref 3.6–5.1)
ALK PHOS: 51 U/L (ref 33–130)
ALT: 30 U/L — ABNORMAL HIGH (ref 6–29)
AST: 18 U/L (ref 10–35)
BUN: 11 mg/dL (ref 7–25)
CALCIUM: 9.5 mg/dL (ref 8.6–10.4)
CO2: 25 mmol/L (ref 20–31)
CREATININE: 1.01 mg/dL (ref 0.50–1.05)
Chloride: 100 mmol/L (ref 98–110)
GFR, EST AFRICAN AMERICAN: 74 mL/min (ref >=60)
GFR, Est Non African American: 64 mL/min (ref >=60)
Glucose, Bld: 77 mg/dL (ref 65–99)
POTASSIUM: 3.8 mmol/L (ref 3.5–5.3)
Sodium: 136 mmol/L (ref 135–146)
Total Bilirubin: 0.3 mg/dL (ref 0.2–1.2)
Total Protein: 7.2 g/dL (ref 6.1–8.1)

## 2015-05-02 LAB — POCT URINALYSIS DIPSTICK
Bilirubin, UA: NEGATIVE
Glucose, UA: NEGATIVE
KETONES UA: NEGATIVE
Leukocytes, UA: NEGATIVE
Nitrite, UA: NEGATIVE
PROTEIN UA: NEGATIVE
RBC UA: NEGATIVE
SPEC GRAV UA: 1.015
Urobilinogen, UA: 0.2
pH, UA: 5.5

## 2015-05-02 LAB — TSH: TSH: 2.15 mIU/L

## 2015-05-02 LAB — GLUCOSE, CAPILLARY: GLUCOSE-CAPILLARY: 108 mg/dL — AB (ref 65–99)

## 2015-05-02 MED ORDER — METFORMIN HCL 500 MG PO TABS: 500.0000 mg | ORAL_TABLET | Freq: Two times a day (BID) | ORAL | Status: DC

## 2015-05-02 MED ORDER — TRIAMTERENE-HCTZ 37.5-25 MG PO CAPS: 1.0000 | ORAL_CAPSULE | ORAL | Status: DC

## 2015-05-02 MED ORDER — METOPROLOL SUCCINATE ER 50 MG PO TB24: 50.0000 mg | ORAL_TABLET | Freq: Every day | ORAL | Status: DC

## 2015-05-02 MED ORDER — GABAPENTIN 100 MG PO CAPS: 100.0000 mg | ORAL_CAPSULE | Freq: Three times a day (TID) | ORAL | Status: DC

## 2015-05-02 MED ORDER — BUSPIRONE HCL 5 MG PO TABS: 5.0000 mg | ORAL_TABLET | Freq: Three times a day (TID) | ORAL | Status: DC

## 2015-05-02 MED FILL — GABAPENTIN 100 MG CAPSULE: 100 | 30 days supply | Qty: 90 | Fill #0

## 2015-05-02 MED FILL — ?TRIAMTERENE/HCTZ 37.5/25TB: 37.5-25 | 30 days supply | Qty: 30 | Fill #0

## 2015-05-02 MED FILL — ?BUSPIRONE HCL 5 MG TABLET: 5 | 20 days supply | Qty: 60 | Fill #0

## 2015-05-02 MED FILL — ?METFORMIN HCL 500MG TABLET: 500 | 30 days supply | Qty: 60 | Fill #0

## 2015-05-02 MED FILL — METOPROLOL SUCC ER 50 MG TA: 50 | 30 days supply | Qty: 30 | Fill #0

## 2015-05-02 NOTE — Progress Notes (Signed)
Subjective:    Patient ID: Kelsey Ortega, female    DOB: 05-03-1960, 55 y.o.   MRN: [id number]  HPI  Kelsey Ortega, a 55 year old female with a history of hypertension, PCOS and heavy menstrual cycles presents to establish care. She says that she was a patient of Dr. Jani Gravel.   Kelsey Ortega has a history of hypertension. Symptoms have been controlled on  Triamterene-hydrochlorothiazide over the past several years. She does not exercise due to a history of chronic pain to right achilles tendon. She also does not follow a low sodium diet or check blood pressures at home.  Patient denies chest pain, dyspnea, fatigue, palpitations and syncope.  Cardiovascular risk factors include: obesity (BMI >= 30 kg/m2) and sedentary lifestyle. She also has a history of polycystic ovarian syndrome which is a cardiac risk factor. Kelsey Ortega reports a history of anxiety. She attributes anxiety to increased stress following the death of her best friend and fiance. She also states that current financial constraints are a source of anxiety and depression. She is currently not under the care of psychiatry or therapist. She maintains that previous anxiety symptoms were controlled on diazepam as needed. She reports feelings of anhedonia, racing thoughts and loss of control. She denies current suicidal and homicidal ideation. Family history is not significant for depression or anxiety.   She is currently complaining of burning/tingling to right lower extremity.  Onset of symptoms was gradual starting several months ago. Symptoms are currently of moderate severity. Symptoms occur intermittently. She endorses mild weakness. The patient denies fever, fatigue, edema, recent falls.    Past Medical History  Diagnosis Date  . Hypertension   . PCOS (polycystic ovarian syndrome)   . Menorrhagia   . Headaches, cluster   . Right knee pain    Past Surgical History  Procedure Laterality Date  . Tubal ligation   1993  . Cystectomy  2008   Allergies  Allergen Reactions  . Augmentin [Amoxicillin-Pot Clavulanate] Diarrhea  . Biaxin [Clarithromycin] Itching  . Limbitrol Ds [Chlordiazepoxide-Amitriptyline] Other (See Comments)    groggy  . Sudafed [Pseudoephedrine Hcl] Palpitations  Review of Systems  Constitutional: Negative.   HENT: Negative.   Eyes: Negative.   Respiratory: Negative.   Cardiovascular: Negative.   Gastrointestinal: Negative.   Endocrine: Negative.  Negative for polydipsia, polyphagia and polyuria.  Genitourinary: Negative.   Musculoskeletal: Negative.   Skin: Negative.   Allergic/Immunologic: Negative.   Neurological: Negative.   Hematological: Negative.   Psychiatric/Behavioral: Negative.        Objective:   Physical Exam  Constitutional: She is oriented to person, place, and time. She appears well-developed and well-nourished.  HENT:  Head: Normocephalic and atraumatic.  Right Ear: External ear normal.  Left Ear: External ear normal.  Mouth/Throat: Oropharynx is clear and moist.  Eyes: Conjunctivae and EOM are normal. Pupils are equal, round, and reactive to light.  Neck: Normal range of motion. Neck supple.  Cardiovascular: Normal rate, regular rhythm, normal heart sounds and intact distal pulses.   Pulmonary/Chest: Effort normal and breath sounds normal.  Abdominal: Soft. Bowel sounds are normal.  Musculoskeletal: Normal range of motion.  Neurological: She is alert and oriented to person, place, and time. She has normal reflexes.  Skin: Skin is warm and dry.  Psychiatric: She has a normal mood and affect. Her behavior is normal. Judgment and thought content normal.       BP 145/89 mmHg  Pulse 99  Temp(Src) 97.9 F (  36.6 C) (Oral)  Resp 18  Ht 5\' 9"  (1.753 m)  Wt 259 lb (117.482 kg)  BMI 38.23 kg/m2  SpO2 97%  LMP 04/30/2015 (Exact Date) Assessment & Plan:   1. Essential hypertension Blood pressure is at goal on current medication regimen. Blood  pressures checked manually (130/80 and 122/86) - metoprolol succinate (TOPROL-XL) 50 MG 24 hr tablet; Take 1 tablet (50 mg total) by mouth daily. Take with or immediately following a meal.  Dispense: 30 tablet; Refill: 5 - triamterene-hydrochlorothiazide (DYAZIDE) 37.5-25 MG capsule; Take 1 each (1 capsule total) by mouth every morning.  Dispense: 30 capsule; Refill: 5 - EKG 12-Lead - POCT urinalysis dipstick - COMPLETE METABOLIC PANEL WITH GFR - CBC with Differential  2. Generalized anxiety disorder Reviewed GAD 7 and PHQ-9. Patient was previously taking diazepam for anxiety. I recommend that patient start a trial of Buspar 5 mg BID. Will follow up in 1 month. Also recommend that patient utilize [employer]  - busPIRone (BUSPAR) 5 MG tablet; Take 1 tablet (5 mg total) by mouth 3 (three) times daily.  Dispense: 60 tablet; Refill: 0  3. Neuropathic pain - gabapentin (NEURONTIN) 100 MG capsule; Take 1 capsule (100 mg total) by mouth 3 (three) times daily.  Dispense: 90 capsule; Refill: 0  4. PCOS (polycystic ovarian syndrome) Recommend a lowfat, low carbohydrate diet divided over 5-6 small meals, increase water intake to 6-8 glasses, and 150 minutes per week of cardiovascular exercise.   - Hemoglobin A1c  5. Diverticulitis of colon without hemorrhage Will review records as they become available.   6. Obesity Recommend a lowfat, low carbohydrate diet divided over 5-6 small meals, increase water intake to 6-8 glasses, and 150 minutes per week of cardiovascular exercise.   - TSH - Lipid Panel; Future  7. Right tibialis tendonitis Patient was under the care of Dr Paulla Dolly for tendonitis. She has been lost to follow up over the past year due to losing her health insurance.   Routine Health Maintenance: Recommend pap smear Recommend a lowfat, low carbohydrate diet divided over 5-6 small meals, increase water intake to 6-8 glasses, and 150 minutes per week of cardiovascular exercise.   Galbladder removed in 2015 at [facility]; will review medical records in care everywhere Diverticuliitis/ Dr. Wallis Mart at [facility];  will have Kelsey Ortega       RTC: 1 month for follow up of neuropathic pain and anxiety  Michael Walrath M, FNP    The patient was given clear instructions to go to ER or return to medical center if symptoms do not improve, worsen or new problems develop. The patient verbalized understanding. Will notify patient with laboratory results.   Marland Kitchen

## 2015-05-02 NOTE — Patient Instructions (Addendum)
Recommend decreasing caffeine intake (coffee :-( Recommend OTC probiotic Align or Florastor daily to increase gastrointestinal flora Increase water intake and daily activity.  Recommend a lowfat, low carbohydrate diet divided over 5-6 small meals, increase water intake to 6-8 glasses, and 150 minutes per week of cardiovascular exercise.    Will start a trial of gabapentin 100 mg three times per day for nerve pain Will start a trial of Buspar 5 mg twice daily for generalized anxiety. Recommend following up with a counselor at [facility] for [address] ([place] in clinic is M-F from 8am to 3 pm)Diverticulitis Diverticulitis is inflammation or infection of small pouches in your colon that form when you have a condition called diverticulosis. The pouches in your colon are called diverticula. Your colon, or large intestine, is where water is absorbed and stool is formed. Complications of diverticulitis can include:  Bleeding.  Severe infection.  Severe pain.  Perforation of your colon.  Obstruction of your colon. CAUSES  Diverticulitis is caused by bacteria. Diverticulitis happens when stool becomes trapped in diverticula. This allows bacteria to grow in the diverticula, which can lead to inflammation and infection. RISK FACTORS People with diverticulosis are at risk for diverticulitis. Eating a diet that does not include enough fiber from fruits and vegetables may make diverticulitis more likely to develop. SYMPTOMS  Symptoms of diverticulitis may include:  Abdominal pain and tenderness. The pain is normally located on the left side of the abdomen, but may occur in other areas.  Fever and chills.  Bloating.  Cramping.  Nausea.  Vomiting.  Constipation.  Diarrhea.  Blood in your stool. DIAGNOSIS  Your health care provider will ask you about your medical history and do a physical exam. You may need to have tests done because many medical conditions can  cause the same symptoms as diverticulitis. Tests may include:  Blood tests.  Urine tests.  Imaging tests of the abdomen, including X-rays and CT scans. When your condition is under control, your health care provider may recommend that you have a colonoscopy. A colonoscopy can show how severe your diverticula are and whether something else is causing your symptoms. TREATMENT  Most cases of diverticulitis are mild and can be treated at home. Treatment may include:  Taking over-the-counter pain medicines.  Following a clear liquid diet.  Taking antibiotic medicines by mouth for 7-10 days. More severe cases may be treated at a hospital. Treatment may include:  Not eating or drinking.  Taking prescription pain medicine.  Receiving antibiotic medicines through an IV tube.  Receiving fluids and nutrition through an IV tube.  Surgery. HOME CARE INSTRUCTIONS   Follow your health care provider's instructions carefully.  Follow a full liquid diet or other diet as directed by your health care provider. After your symptoms improve, your health care provider may tell you to change your diet. He or she may recommend you eat a high-fiber diet. Fruits and vegetables are good sources of fiber. Fiber makes it easier to pass stool.  Take fiber supplements or probiotics as directed by your health care provider.  Only take medicines as directed by your health care provider.  Keep all your follow-up appointments. SEEK MEDICAL CARE IF:   Your pain does not improve.  You have a hard time eating food.  Your bowel movements do not return to normal. SEEK IMMEDIATE MEDICAL CARE IF:   Your pain becomes worse.  Your symptoms do not get better.  Your symptoms suddenly get worse.  You have a fever.  You have repeated vomiting.  You have bloody or black, tarry stools. MAKE SURE YOU:   Understand these instructions.  Will watch your condition.  Will get help right away if you are not  doing well or get worse.   This information is not intended to replace advice given to you by your health care provider. Make sure you discuss any questions you have with your health care provider.   Document Released: 12/02/2004 Document Revised: 02/27/2013 Document Reviewed: 01/17/2013 Elsevier Interactive Patient Education 2016 [employer]. Exercising to [employer] Exercising can help you to lose weight. In order to lose weight through exercise, you need to do vigorous-intensity exercise. You can tell that you are exercising with vigorous intensity if you are breathing very hard and fast and cannot hold a conversation while exercising. Moderate-intensity exercise helps to maintain your current weight. You can tell that you are exercising at a moderate level if you have a higher heart rate and faster breathing, but you are still able to hold a conversation. HOW OFTEN SHOULD I EXERCISE? Choose an activity that you enjoy and set realistic goals. Your health care provider can help you to make an activity plan that works for you. Exercise regularly as directed by your health care provider. This may include:  Doing resistance training twice each week, such as:  Push-ups.  Sit-ups.  Lifting weights.  Using resistance bands.  Doing a given intensity of exercise for a given amount of time. Choose from these options:  150 minutes of moderate-intensity exercise every week.  75 minutes of vigorous-intensity exercise every week.  A mix of moderate-intensity and vigorous-intensity exercise every week. Children, pregnant women, people who are out of shape, people who are overweight, and older adults may need to consult a health care provider for individual recommendations. If you have any sort of medical condition, be sure to consult your health care provider before starting a new exercise program. WHAT ARE SOME ACTIVITIES THAT CAN HELP ME TO LOSE WEIGHT?   Walking at a rate of at least 4.5  miles an hour.  Jogging or running at a rate of 5 miles per hour.  Biking at a rate of at least 10 miles per hour.  Lap swimming.  Roller-skating or in-line skating.  Cross-country skiing.  Vigorous competitive sports, such as football, basketball, and soccer.  Jumping rope.  Aerobic dancing. HOW CAN I BE MORE ACTIVE IN MY DAY-TO-DAY ACTIVITIES?  Use the stairs instead of the elevator.  Take a walk during your lunch break.  If you drive, park your car farther away from work or school.  If you take public transportation, get off one stop early and walk the rest of the way.  Make all of your phone calls while standing up and walking around.  Get up, stretch, and walk around every 30 minutes throughout the day. WHAT GUIDELINES SHOULD I FOLLOW WHILE EXERCISING?  Do not exercise so much that you hurt yourself, feel dizzy, or get very short of breath.  Consult your health care provider prior to starting a new exercise program.  Wear comfortable clothes and shoes with good support.  Drink plenty of water while you exercise to prevent dehydration or heat stroke. Body water is lost during exercise and must be replaced.  Work out until you breathe faster and your heart beats faster.   This information is not intended to replace advice given to you by your health care provider. Make sure you  discuss any questions you have with your health care provider.   Document Released: 03/27/2010 Document Revised: 03/15/2014 Document Reviewed: 07/26/2013 Elsevier Interactive Patient Education 2016 Elsevier Inc. Generalized Anxiety Disorder Generalized anxiety disorder (GAD) is a mental disorder. It interferes with life functions, including relationships, work, and school. GAD is different from normal anxiety, which everyone experiences at some point in their lives in response to specific life events and activities. Normal anxiety actually helps [place] prepare for and get through these life  events and activities. Normal anxiety goes away after the event or activity is over.  GAD causes anxiety that is not necessarily related to specific events or activities. It also causes excess anxiety in proportion to specific events or activities. The anxiety associated with GAD is also difficult to control. GAD can vary from mild to severe. People with severe GAD can have intense waves of anxiety with physical symptoms (panic attacks).  SYMPTOMS The anxiety and worry associated with GAD are difficult to control. This anxiety and worry are related to many life events and activities and also occur more days than not for 6 months or longer. People with GAD also have three or more of the following symptoms (one or more in children):  Restlessness.   Fatigue.  Difficulty concentrating.   Irritability.  Muscle tension.  Difficulty sleeping or unsatisfying sleep. DIAGNOSIS GAD is diagnosed through an assessment by your health care provider. Your health care provider will ask you questions aboutyour mood,physical symptoms, and events in your life. Your health care provider may ask you about your medical history and use of alcohol or drugs, including prescription medicines. Your health care provider may also do a physical exam and blood tests. Certain medical conditions and the use of certain substances can cause symptoms similar to those associated with GAD. Your health care provider may refer you to a mental health specialist for further evaluation. TREATMENT The following therapies are usually used to treat GAD:   Medication. Antidepressant medication usually is prescribed for long-term daily control. Antianxiety medicines may be added in severe cases, especially when panic attacks occur.   Talk therapy (psychotherapy). Certain types of talk therapy can be helpful in treating GAD by providing support, education, and guidance. A form of talk therapy called cognitive behavioral therapy can  teach you healthy ways to think about and react to daily life events and activities.  Stress managementtechniques. These include yoga, meditation, and exercise and can be very helpful when they are practiced regularly. A mental health specialist can help determine which treatment is best for you. Some people see improvement with one therapy. However, other people require a combination of therapies.   This information is not intended to replace advice given to you by your health care provider. Make sure you discuss any questions you have with your health care provider.   Document Released: 06/19/2012 Document Revised: 03/15/2014 Document Reviewed: 06/19/2012 Elsevier Interactive Patient Education 2016 [place] DASH stands for "Dietary Approaches to Stop Hypertension." The DASH eating plan is a healthy eating plan that has been shown to reduce high blood pressure (hypertension). Additional health benefits may include reducing the risk of type 2 diabetes mellitus, heart disease, and stroke. The DASH eating plan may also help with weight loss. WHAT DO I NEED TO KNOW ABOUT THE DASH EATING PLAN? For the DASH eating plan, you will follow these general guidelines:  Choose foods with a percent daily value for sodium of less than 5% (as listed on  the food label).  Use salt-free seasonings or herbs instead of table salt or sea salt.  Check with your health care provider or pharmacist before using salt substitutes.  Eat lower-sodium products, often labeled as "lower sodium" or "no salt added."  Eat fresh foods.  Eat more vegetables, fruits, and low-fat dairy products.  Choose whole grains. Look for the word "whole" as the first word in the ingredient list.  Choose fish and skinless chicken or [place] more often than red meat. Limit fish, poultry, and meat to 6 oz (170 g) each day.  Limit sweets, desserts, sugars, and sugary drinks.  Choose heart-healthy fats.  Limit  cheese to 1 oz (28 g) per day.  Eat more home-cooked food and less restaurant, buffet, and fast food.  Limit fried foods.  Cook foods using methods other than frying.  Limit canned vegetables. If you do use them, rinse them well to decrease the sodium.  When eating at a restaurant, ask that your food be prepared with less salt, or no salt if possible. WHAT FOODS CAN I EAT? Seek help from a dietitian for individual calorie needs. Grains Whole grain or whole wheat bread. Brown rice. Whole grain or whole wheat pasta. Quinoa, bulgur, and whole grain cereals. Low-sodium cereals. Corn or whole wheat flour tortillas. Whole grain cornbread. Whole grain crackers. Low-sodium crackers. Vegetables Fresh or frozen vegetables (raw, steamed, roasted, or grilled). Low-sodium or reduced-sodium tomato and vegetable juices. Low-sodium or reduced-sodium tomato sauce and paste. Low-sodium or reduced-sodium canned vegetables.  Fruits All fresh, canned (in natural juice), or frozen fruits. Meat and Other Protein Products Ground beef (85% or leaner), grass-fed beef, or beef trimmed of fat. Skinless chicken or [place]. Ground chicken or [place]. Pork trimmed of fat. All fish and seafood. Eggs. Dried beans, peas, or lentils. Unsalted nuts and seeds. Unsalted canned beans. Dairy Low-fat dairy products, such as skim or 1% milk, 2% or reduced-fat cheeses, low-fat ricotta or cottage cheese, or plain low-fat yogurt. Low-sodium or reduced-sodium cheeses. Fats and Oils Tub margarines without trans fats. Light or reduced-fat mayonnaise and salad dressings (reduced sodium). Avocado. Safflower, olive, or canola oils. Natural peanut or almond butter. Other Unsalted popcorn and pretzels. The items listed above may not be a complete list of recommended foods or beverages. Contact your dietitian for more options. WHAT FOODS ARE NOT RECOMMENDED? Grains White bread. White pasta. White rice. Refined cornbread. Bagels and  croissants. Crackers that contain trans fat. Vegetables Creamed or fried vegetables. Vegetables in a cheese sauce. Regular canned vegetables. Regular canned tomato sauce and paste. Regular tomato and vegetable juices. Fruits Dried fruits. Canned fruit in light or heavy syrup. Fruit juice. Meat and Other Protein Products Fatty cuts of meat. Ribs, chicken wings, bacon, sausage, bologna, salami, chitterlings, fatback, hot dogs, bratwurst, and packaged luncheon meats. Salted nuts and seeds. Canned beans with salt. Dairy Whole or 2% milk, cream, half-and-half, and cream cheese. Whole-fat or sweetened yogurt. Full-fat cheeses or blue cheese. Nondairy creamers and whipped toppings. Processed cheese, cheese spreads, or cheese curds. Condiments Onion and garlic salt, seasoned salt, table salt, and sea salt. Canned and packaged gravies. Worcestershire sauce. Tartar sauce. Barbecue sauce. Teriyaki sauce. Soy sauce, including reduced sodium. Steak sauce. Fish sauce. Oyster sauce. Cocktail sauce. Horseradish. Ketchup and mustard. Meat flavorings and tenderizers. Bouillon cubes. Hot sauce. Tabasco sauce. Marinades. Taco seasonings. Relishes. Fats and Oils Butter, stick margarine, lard, shortening, ghee, and bacon fat. Coconut, palm kernel, or palm oils. Regular salad dressings. Other Angie Fava and  olives. Salted popcorn and pretzels. The items listed above may not be a complete list of foods and beverages to avoid. Contact your dietitian for more information. WHERE CAN I FIND MORE INFORMATION? National Heart, Lung, and Blood Institute: travelstabloid.com   This information is not intended to replace advice given to you by your health care provider. Make sure you discuss any questions you have with your health care provider.   Document Released: 02/11/2011 Document Revised: 03/15/2014 Document Reviewed: 12/27/2012 Elsevier Interactive Patient Education [employer].

## 2015-05-05 ENCOUNTER — Telehealth: Payer: Self-pay

## 2015-05-05 NOTE — Telephone Encounter (Signed)
Patient called back and I advised of labs and to continue current dosage of medication. Patient verbalized understanding and will keep follow up appointment. Thanks!

## 2015-05-05 NOTE — Telephone Encounter (Signed)
Called, no answer. Left message for patient to call back regarding labs. Thanks!

## 2015-05-05 NOTE — Telephone Encounter (Signed)
-----   Message from Dorena Dew, [place] sent at 05/03/2015 10:25 AM EST ----- Regarding: lab results Please inform Kelsey Ortega that hemoglobin a1C is mildly elevated. Will continue metformin at current dosage 500 mg BID. Also, Recommend a lowfat, low carbohydrate diet divided over 5-6 small meals, increase water intake to 6-8 glasses, and start walking regimen 30 minutes 3 days per week. Follow up in office as scheduled  Thanks  ----- Message -----    From: Lab In [place]: 05/02/2015   2:20 PM      To: Dorena Dew, FNP

## 2015-05-06 LAB — POCT URINALYSIS DIP (DEVICE)
BILIRUBIN URINE: NEGATIVE
GLUCOSE, UA: NEGATIVE mg/dL
Hgb urine dipstick: NEGATIVE
KETONES UR: NEGATIVE mg/dL
LEUKOCYTES UA: NEGATIVE
Nitrite: NEGATIVE
PROTEIN: NEGATIVE mg/dL
SPECIFIC GRAVITY, URINE: 1.015 (ref 1.005–1.030)
Urobilinogen, UA: 0.2 mg/dL (ref 0.0–1.0)
pH: 5.5 (ref 5.0–8.0)

## 2015-05-30 ENCOUNTER — Telehealth: Payer: Self-pay | Admitting: *Deleted

## 2015-05-30 ENCOUNTER — Ambulatory Visit: Payer: No Typology Code available for payment source | Admitting: Family Medicine

## 2015-05-30 NOTE — Telephone Encounter (Signed)
Patient is requesting a dental, women, and opthalmalogy referral. Patient is wanting dental maintenance.

## 2015-06-03 MED FILL — METOPROLOL SUCC ER 50 MG TA: 50 | 30 days supply | Qty: 30 | Fill #1

## 2015-06-03 MED FILL — TRIAMTERENE-HCTZ 37.5-25 MG: 37.5-25 | 30 days supply | Qty: 30 | Fill #1

## 2015-06-03 MED FILL — metFORMIN HCL 500 MG TABS: 500 | 30 days supply | Qty: 60 | Fill #1

## 2015-06-04 ENCOUNTER — Other Ambulatory Visit: Payer: Self-pay | Admitting: Family Medicine

## 2015-06-04 ENCOUNTER — Other Ambulatory Visit: Payer: Self-pay

## 2015-06-04 DIAGNOSIS — Z1239 Encounter for other screening for malignant neoplasm of breast: Secondary | ICD-10-CM

## 2015-06-04 DIAGNOSIS — Z1231 Encounter for screening mammogram for malignant neoplasm of breast: Secondary | ICD-10-CM

## 2015-06-13 ENCOUNTER — Ambulatory Visit
Admission: RE | Admit: 2015-06-13 | Discharge: 2015-06-13 | Disposition: A | Discharge status: Home / Self Care | Payer: No Typology Code available for payment source | Source: Ambulatory Visit

## 2015-06-13 DIAGNOSIS — Z1231 Encounter for screening mammogram for malignant neoplasm of breast: Secondary | ICD-10-CM

## 2015-06-26 ENCOUNTER — Ambulatory Visit (INDEPENDENT_AMBULATORY_CARE_PROVIDER_SITE_OTHER): Payer: No Typology Code available for payment source | Admitting: Family Medicine

## 2015-06-26 ENCOUNTER — Encounter: Payer: Self-pay | Admitting: Family Medicine

## 2015-06-26 VITALS — BP 128/82 | HR 85 | Temp 98.3°F | Ht 69.0 in | Wt 257.0 lb

## 2015-06-26 DIAGNOSIS — M79671 Pain in right foot: Secondary | ICD-10-CM

## 2015-06-26 DIAGNOSIS — G8929 Other chronic pain: Secondary | ICD-10-CM

## 2015-06-26 DIAGNOSIS — F32A Depression, unspecified: Secondary | ICD-10-CM | POA: Insufficient documentation

## 2015-06-26 DIAGNOSIS — F329 Major depressive disorder, single episode, unspecified: Secondary | ICD-10-CM

## 2015-06-26 DIAGNOSIS — K029 Dental caries, unspecified: Secondary | ICD-10-CM

## 2015-06-26 DIAGNOSIS — M5441 Lumbago with sciatica, right side: Secondary | ICD-10-CM

## 2015-06-26 DIAGNOSIS — M79673 Pain in unspecified foot: Secondary | ICD-10-CM

## 2015-06-26 DIAGNOSIS — I10 Essential (primary) hypertension: Secondary | ICD-10-CM

## 2015-06-26 MED ORDER — KETOROLAC TROMETHAMINE 60 MG/2ML IM SOLN
60.0000 mg | Freq: Once | INTRAMUSCULAR | Status: AC
  Administered 2015-06-26: 60 mg via INTRAMUSCULAR

## 2015-06-26 MED ORDER — DULOXETINE HCL 20 MG PO CPEP: 20.0000 mg | ORAL_CAPSULE | Freq: Every day | ORAL | Status: DC

## 2015-06-26 MED ORDER — ACETAMINOPHEN-CODEINE 300-30 MG PO TABS: 1.0000 | ORAL_TABLET | ORAL | Status: DC | PRN

## 2015-06-26 NOTE — Progress Notes (Signed)
Subjective:    Patient ID: Kelsey Ortega, female    DOB: 01/11/1961, 55 y.o.   MRN: [id number]  HPI  Kelsey Ortega, a 55 year old female with a history of hypertension, depression, and right lower extremity pain presents for a 1 month follow up.   Kelsey Ortega has a history of hypertension, which is controlled on Triamterene-hydrochlorothiazide.  She does not exercise due to a history of chronic pain to right achilles tendon. She also does not follow a low sodium diet or check blood pressures at home.  Patient denies chest pain, dyspnea, fatigue, palpitations and syncope.  Cardiovascular risk factors include: obesity (BMI >= 30 kg/m2) and sedentary lifestyle.   [name] reports a history of depression and anxiety. She was started on Buspar 1 month ago. She discontinued medications after 2 weeks. She attributes anxiety to increased stress following the death of her best friend and fiance. She also states that current financial constraints are a source of anxiety and depression. She is currently not under the care of psychiatry or therapist. She maintains that previous anxiety symptoms were controlled on diazepam as needed. She reports feelings of anhedonia, racing thoughts and loss of control. She denies current suicidal and homicidal ideation. Family history is not significant for depression or anxiety.   She is currently complaining of burning/tingling to right lower extremity.  Onset of symptoms was gradual starting several months ago. Pain intensity is 6/10.  Symptoms are currently of moderate severity. Symptoms occur intermittently. She endorses mild weakness. The patient denies fever, fatigue, edema, recent falls.    Past Medical History  Diagnosis Date  . Hypertension   . PCOS (polycystic ovarian syndrome)   . Menorrhagia   . Headaches, cluster   . Right knee pain    Past Surgical History  Procedure Laterality Date  . Tubal ligation  1993  . Cystectomy  2008    Allergies  Allergen Reactions  . Augmentin [Amoxicillin-Pot Clavulanate] Diarrhea  . Biaxin [Clarithromycin] Itching  . Limbitrol Ds [Chlordiazepoxide-Amitriptyline] Other (See Comments)    groggy  . Penicillins Other (See Comments)    Hives, headache, vomiting  . Latex Rash  . Sudafed [Pseudoephedrine Hcl] Palpitations  Review of Systems  Constitutional: Negative.   HENT: Negative.   Eyes: Negative.   Respiratory: Negative.   Cardiovascular: Negative.   Gastrointestinal: Negative.   Endocrine: Negative.  Negative for polydipsia, polyphagia and polyuria.  Genitourinary: Negative.   Musculoskeletal: Negative.   Skin: Negative.   Allergic/Immunologic: Negative.   Neurological: Negative.   Hematological: Negative.   Psychiatric/Behavioral: Negative.        Objective:   Physical Exam  Constitutional: She is oriented to person, place, and time. She appears well-developed and well-nourished.  HENT:  Head: Normocephalic and atraumatic.  Right Ear: External ear normal.  Left Ear: External ear normal.  Mouth/Throat: Oropharynx is clear and moist.  Eyes: Conjunctivae and EOM are normal. Pupils are equal, round, and reactive to light.  Neck: Normal range of motion. Neck supple.  Cardiovascular: Normal rate, regular rhythm, normal heart sounds and intact distal pulses.   Pulmonary/Chest: Effort normal and breath sounds normal.  Abdominal: Soft. Bowel sounds are normal.  Musculoskeletal: Normal range of motion.  Neurological: She is alert and oriented to person, place, and time. She has normal reflexes.  Skin: Skin is warm and dry.  Psychiatric: She has a normal mood and affect. Her behavior is normal. Judgment and thought content normal.  BP 128/82 mmHg  Pulse 85  Temp(Src) 98.3 F (36.8 C) (Oral)  Ht 5\' 9"  (1.753 Ortega)  Wt 257 lb (116.574 kg)  BMI 37.93 kg/m2  SpO2 96%  LMP 05/21/2015 (Within Days) Assessment & Plan:   1. Essential hypertension Blood pressure is  at goal on current medication regimen. Will continue medication regimen.   2. Depression Patient denies suicidal or homicidal intent.  - DULoxetine (CYMBALTA) 20 MG capsule; Take 1 capsule (20 mg total) by mouth daily.  Dispense: 30 capsule; Refill: 1 Depression screen [facility] 2/9 06/26/2015 05/02/2015  Decreased Interest 1 1  Down, Depressed, Hopeless 1 1  PHQ - 2 Score 2 2  Altered sleeping 1 1  Tired, decreased energy 2 0  Change in appetite 1 0  Feeling bad or failure about yourself  1 1  Trouble concentrating 1 1  Moving slowly or fidgety/restless 1 0  Suicidal thoughts 1 0  PHQ-9 Score 10 5    3. Chronic foot pain, right - Ambulatory referral to Sports Medicine  4. Right-sided low back pain with right-sided sciatica Recommend applying warm, moist compresses to lower back. Will reviewed previous imaging as records become available.  - ketorolac (TORADOL) injection 60 mg; Inject 2 mLs (60 mg total) into the muscle once. - Acetaminophen-Codeine (TYLENOL/CODEINE #3) 300-30 MG tablet; Take 1 tablet by mouth every 4 (four) hours as needed for pain.  Dispense: 30 tablet; Refill: 0 - Ambulatory referral to Sports Medicine  5. Dental caries - Ambulatory referral to Dentistry  Routine Health Maintenance: Recommend pap smear Recommend a lowfat, low carbohydrate diet divided over 5-6 small meals, increase water intake to 6-8 glasses, and 150 minutes per week of cardiovascular exercise.      RTC: 3 months for hypertension  Kelsey M, FNP    The patient was given clear instructions to go to ER or return to medical center if symptoms do not improve, worsen or new problems develop. The patient verbalized understanding.    Marland Kitchen

## 2015-06-27 MED FILL — ACETAMINOPHEN/COD #3 TABLET: 300-30 | 8 days supply | Qty: 30 | Fill #0

## 2015-06-30 ENCOUNTER — Ambulatory Visit: Payer: No Typology Code available for payment source | Admitting: Family Medicine

## 2015-07-07 ENCOUNTER — Ambulatory Visit: Payer: No Typology Code available for payment source | Admitting: Family Medicine

## 2015-07-09 ENCOUNTER — Telehealth: Payer: Self-pay

## 2015-07-09 DIAGNOSIS — E282 Polycystic ovarian syndrome: Secondary | ICD-10-CM

## 2015-07-09 DIAGNOSIS — I10 Essential (primary) hypertension: Secondary | ICD-10-CM

## 2015-07-09 MED ORDER — METFORMIN HCL 500 MG PO TABS: 500.0000 mg | ORAL_TABLET | Freq: Every day | ORAL | Status: DC

## 2015-07-09 MED ORDER — METOPROLOL SUCCINATE ER 50 MG PO TB24: 50.0000 mg | ORAL_TABLET | Freq: Every day | ORAL | Status: DC

## 2015-07-09 MED ORDER — TRIAMTERENE-HCTZ 37.5-25 MG PO CAPS: 1.0000 | ORAL_CAPSULE | ORAL | Status: DC

## 2015-07-09 MED FILL — ?TRIAMTERENE/HCTZ 37.5/25TB: 37.5-25 | 30 days supply | Qty: 30 | Fill #0

## 2015-07-09 MED FILL — METOPROLOL SUCC ER 50 MG TA: 50 | 30 days supply | Qty: 30 | Fill #0

## 2015-07-09 MED FILL — ?METFORMIN HCL 500MG TABLET: 500 | 30 days supply | Qty: 30 | Fill #0

## 2015-07-09 NOTE — Telephone Encounter (Signed)
Refills have been sent into pharmacy. Thanks!  

## 2015-07-09 NOTE — Telephone Encounter (Signed)
Pt called and requested a medication refill on metoprolol, triamterene, and metformin. Thanks!

## 2015-07-18 ENCOUNTER — Ambulatory Visit: Payer: No Typology Code available for payment source | Admitting: Family Medicine

## 2015-08-07 ENCOUNTER — Ambulatory Visit: Payer: No Typology Code available for payment source | Admitting: Family Medicine

## 2015-08-07 MED FILL — ?DULOXETINE HCL DR 20 MG CA: 20 | 30 days supply | Qty: 30 | Fill #0

## 2015-08-07 MED FILL — metFORMIN HCL 500 MG TABS: 500 | 30 days supply | Qty: 30 | Fill #1

## 2015-08-07 MED FILL — METOPROLOL SUCC ER 50 MG TA: 50 | 30 days supply | Qty: 30 | Fill #1

## 2015-08-07 MED FILL — ?TRIAMTERENE/HCTZ 37.5/25TB: 37.5-25 | 30 days supply | Qty: 30 | Fill #1

## 2015-08-26 ENCOUNTER — Ambulatory Visit: Payer: No Typology Code available for payment source

## 2015-09-03 ENCOUNTER — Ambulatory Visit: Payer: No Typology Code available for payment source | Attending: Internal Medicine

## 2015-09-15 MED FILL — metFORMIN HCL 500 MG TABS: 500 | 30 days supply | Qty: 30 | Fill #2

## 2015-09-15 MED FILL — METOPROLOL SUCC ER 50 MG TA: 50 | 30 days supply | Qty: 30 | Fill #2

## 2015-09-15 MED FILL — ?TRIAMTERENE/HCTZ 37.5/25TB: 37.5-25 | 30 days supply | Qty: 30 | Fill #2

## 2015-10-09 MED FILL — METOPROLOL SUCC ER 50 MG TA: 50 | 30 days supply | Qty: 30 | Fill #3

## 2015-10-09 MED FILL — ?DULOXETINE HCL DR 20 MG CA: 20 | 30 days supply | Qty: 30 | Fill #1

## 2015-10-09 MED FILL — metFORMIN HCL 500 MG TABS: 500 | 30 days supply | Qty: 30 | Fill #3

## 2015-10-09 MED FILL — ?TRIAMTERENE/HCTZ 37.5/25TB: 37.5-25 | 30 days supply | Qty: 30 | Fill #3

## 2015-10-13 ENCOUNTER — Ambulatory Visit: Payer: No Typology Code available for payment source

## 2015-10-15 ENCOUNTER — Ambulatory Visit: Payer: No Typology Code available for payment source

## 2015-11-04 ENCOUNTER — Other Ambulatory Visit: Payer: Self-pay | Admitting: Family Medicine

## 2015-11-04 DIAGNOSIS — F329 Major depressive disorder, single episode, unspecified: Secondary | ICD-10-CM

## 2015-11-04 DIAGNOSIS — F32A Depression, unspecified: Secondary | ICD-10-CM

## 2015-11-05 ENCOUNTER — Other Ambulatory Visit: Payer: Self-pay | Admitting: Family Medicine

## 2015-11-05 DIAGNOSIS — F32A Depression, unspecified: Secondary | ICD-10-CM

## 2015-11-05 DIAGNOSIS — F329 Major depressive disorder, single episode, unspecified: Secondary | ICD-10-CM

## 2015-11-11 MED FILL — TRIAMTERENE-HCTZ 37.5-25 MG: 37.5-25 | 30 days supply | Qty: 30 | Fill #2

## 2015-11-11 MED FILL — ?METFORMIN HCL 500MG TABLET: 500 | 30 days supply | Qty: 60 | Fill #2

## 2015-11-11 MED FILL — ?DULOXETINE HCL DR 20 MG CA: 20 | 30 days supply | Qty: 30 | Fill #0

## 2015-11-11 MED FILL — METOPROLOL SUCC ER 50 MG TA: 50 | 30 days supply | Qty: 30 | Fill #2

## 2015-11-27 MED FILL — metroNIDAZOLE 500 MG TABS: 500 | 10 days supply | Qty: 30 | Fill #0

## 2015-11-27 MED FILL — DOXYCYCLINE 100 MG TABLET: 100 | 7 days supply | Qty: 14 | Fill #0

## 2015-12-08 MED FILL — ?TRIAMTERENE/HCTZ 37.5/25TB: 37.5-25 | 30 days supply | Qty: 30 | Fill #3

## 2015-12-08 MED FILL — METOPROLOL SUCC ER 50 MG TA: 50 | 30 days supply | Qty: 30 | Fill #3

## 2015-12-09 ENCOUNTER — Ambulatory Visit: Payer: No Typology Code available for payment source | Admitting: Family Medicine

## 2015-12-10 MED FILL — DOXYCYCLINE 100 MG TABLET: 100 | 7 days supply | Qty: 14 | Fill #0

## 2015-12-24 DIAGNOSIS — R109 Unspecified abdominal pain: Secondary | ICD-10-CM | POA: Diagnosis not present

## 2015-12-24 DIAGNOSIS — M5441 Lumbago with sciatica, right side: Secondary | ICD-10-CM | POA: Diagnosis not present

## 2015-12-24 DIAGNOSIS — G8929 Other chronic pain: Secondary | ICD-10-CM | POA: Diagnosis not present

## 2015-12-25 MED FILL — ?METRONIDAZOLE 500 MG TABLE: 500 | 10 days supply | Qty: 30 | Fill #0

## 2015-12-25 MED FILL — levoFLOXacin 500 MG TABS: 500 | 10 days supply | Qty: 10 | Fill #0

## 2015-12-31 MED FILL — METOPROLOL SUCC ER 50 MG TA: 50 | 30 days supply | Qty: 30 | Fill #4

## 2015-12-31 MED FILL — ?TRIAMTERENE/HCTZ 37.5/25TB: 37.5-25 | 30 days supply | Qty: 30 | Fill #4

## 2016-01-26 ENCOUNTER — Encounter: Payer: Self-pay | Admitting: Family Medicine

## 2016-01-26 ENCOUNTER — Ambulatory Visit (INDEPENDENT_AMBULATORY_CARE_PROVIDER_SITE_OTHER): Payer: Self-pay | Admitting: Family Medicine

## 2016-01-26 VITALS — BP 128/88 | HR 86 | Temp 98.2°F | Resp 14 | Ht 69.0 in | Wt 260.0 lb

## 2016-01-26 DIAGNOSIS — M792 Neuralgia and neuritis, unspecified: Secondary | ICD-10-CM

## 2016-01-26 DIAGNOSIS — I10 Essential (primary) hypertension: Secondary | ICD-10-CM

## 2016-01-26 DIAGNOSIS — E282 Polycystic ovarian syndrome: Secondary | ICD-10-CM

## 2016-01-26 DIAGNOSIS — M5441 Lumbago with sciatica, right side: Secondary | ICD-10-CM

## 2016-01-26 LAB — CBC WITH DIFFERENTIAL/PLATELET
BASOS ABS: 0 {cells}/uL (ref 0–200)
Basophils Relative: 0 %
EOS ABS: 52 {cells}/uL (ref 15–500)
Eosinophils Relative: 1 %
HCT: 42.5 % (ref 35.0–45.0)
Hemoglobin: 14.5 g/dL (ref 11.7–15.5)
LYMPHS PCT: 36 %
Lymphs Abs: 1872 cells/uL (ref 850–3900)
MCH: 28.7 pg (ref 27.0–33.0)
MCHC: 34.1 g/dL (ref 32.0–36.0)
MCV: 84.2 fL (ref 80.0–100.0)
MONOS PCT: 12 %
MPV: 8.7 fL (ref 7.5–12.5)
Monocytes Absolute: 624 cells/uL (ref 200–950)
Neutro Abs: 2652 cells/uL (ref 1500–7800)
Neutrophils Relative %: 51 %
PLATELETS: 374 10*3/uL (ref 140–400)
RBC: 5.05 MIL/uL (ref 3.80–5.10)
RDW: 14.6 % (ref 11.0–15.0)
WBC: 5.2 10*3/uL (ref 3.8–10.8)

## 2016-01-26 LAB — COMPLETE METABOLIC PANEL WITH GFR
ALBUMIN: 4.5 g/dL (ref 3.6–5.1)
ALT: 17 U/L (ref 6–29)
AST: 15 U/L (ref 10–35)
Alkaline Phosphatase: 59 U/L (ref 33–130)
BILIRUBIN TOTAL: 0.6 mg/dL (ref 0.2–1.2)
BUN: 14 mg/dL (ref 7–25)
CO2: 23 mmol/L (ref 20–31)
CREATININE: 0.9 mg/dL (ref 0.50–1.05)
Calcium: 10 mg/dL (ref 8.6–10.4)
Chloride: 101 mmol/L (ref 98–110)
GFR, EST AFRICAN AMERICAN: 84 mL/min (ref >=60)
GFR, Est Non African American: 73 mL/min (ref >=60)
GLUCOSE: 100 mg/dL — AB (ref 65–99)
Potassium: 3.8 mmol/L (ref 3.5–5.3)
Sodium: 135 mmol/L (ref 135–146)
TOTAL PROTEIN: 7.7 g/dL (ref 6.1–8.1)

## 2016-01-26 MED ORDER — IBUPROFEN 600 MG PO TABS: 600.0000 mg | ORAL_TABLET | Freq: Three times a day (TID) | ORAL | 0 refills | Status: DC | PRN

## 2016-01-26 MED ORDER — ACETAMINOPHEN-CODEINE 300-30 MG PO TABS: 1.0000 | ORAL_TABLET | ORAL | 0 refills | Status: DC | PRN

## 2016-01-26 MED FILL — ?IBUPROFEN 600 MG TABLET: 600 MG | 10 days supply | Qty: 30 | Fill #0

## 2016-01-27 MED FILL — METOPROLOL SUCC ER 50 MG TA: 50 | 30 days supply | Qty: 30 | Fill #5

## 2016-01-27 MED FILL — ?TRIAMTERENE/HCTZ 37.5/25TB: 37.5-25 | 30 days supply | Qty: 30 | Fill #5

## 2016-01-28 MED ORDER — METOPROLOL SUCCINATE ER 50 MG PO TB24: 50.0000 mg | ORAL_TABLET | Freq: Every day | ORAL | 3 refills | Status: DC

## 2016-01-28 MED ORDER — GABAPENTIN 100 MG PO CAPS: 100.0000 mg | ORAL_CAPSULE | Freq: Three times a day (TID) | ORAL | 0 refills | Status: DC

## 2016-01-28 MED ORDER — METFORMIN HCL 500 MG PO TABS
500.0000 mg | ORAL_TABLET | Freq: Every day | ORAL | 3 refills | Status: DC
Start: 2016-01-28 — End: 2016-06-25

## 2016-01-28 MED ORDER — TRIAMTERENE-HCTZ 37.5-25 MG PO CAPS: 1.0000 | ORAL_CAPSULE | ORAL | 3 refills | Status: DC

## 2016-01-28 NOTE — Progress Notes (Signed)
Kelsey Ortega, is a 55 y.o. female  [id number]  [id number]  DOB - 08-07-1960  CC:  Chief Complaint  Patient presents with  . anxiety    not sleeping well, valium seems to work better  . leg pain    still having leg pain x 2 years  . ear popping    recent ear infection ? resolved  . declines flu shot       HPI: Kelsey Ortega is a 55 y.o. female here for follow-up. She has a history of hypertension, prediabetes,  PCOS, headaches, right knee pain. She states she had an ear and sinus infection and went for treatment to [facility].  I have explained that it is not appropriate to be seeing two different primary care providers. She brings paperwork to be completed. I have explained that I am unable to do that today but will complete on Wednesday afternoon.. She reports needing refills on several medications.  Allergies  Allergen Reactions  . Augmentin [Amoxicillin-Pot Clavulanate] Diarrhea  . Biaxin [Clarithromycin] Itching  . Limbitrol Ds [Chlordiazepoxide-Amitriptyline] Other (See Comments)    groggy  . Penicillins Other (See Comments)    Hives, headache, vomiting  . Latex Rash  . Sudafed [Pseudoephedrine Hcl] Palpitations   Past Medical History:  Diagnosis Date  . Headaches, cluster   . Hypertension   . Menorrhagia   . PCOS (polycystic ovarian syndrome)   . Right knee pain    Current Outpatient Prescriptions on File Prior to Visit  Medication Sig Dispense Refill  . aspirin EC 81 MG tablet Take 81 mg by mouth.    . metFORMIN (GLUCOPHAGE) 500 MG tablet Take 1 tablet (500 mg total) by mouth daily with breakfast. 30 tablet 3  . metoprolol succinate (TOPROL-XL) 50 MG 24 hr tablet Take 1 tablet (50 mg total) by mouth daily. Take with or immediately following a meal. 30 tablet 3  . triamterene-hydrochlorothiazide (DYAZIDE) 37.5-25 MG capsule Take 1 each (1 capsule total) by mouth every morning. 30 capsule 3  . DULoxetine (CYMBALTA) 20 MG capsule  TAKE 1 CAPSULE BY MOUTH DAILY. (Patient not taking: Reported on 01/26/2016) 30 capsule 1  . DULoxetine (CYMBALTA) 20 MG capsule TAKE 1 CAPSULE BY MOUTH DAILY. (Patient not taking: Reported on 01/26/2016) 30 capsule 1  . gabapentin (NEURONTIN) 100 MG capsule Take 1 capsule (100 mg total) by mouth 3 (three) times daily. (Patient not taking: Reported on 01/26/2016) 90 capsule 0   No current facility-administered medications on file prior to visit.    Family History  Problem Relation Age of Onset  . Hypertension Maternal Grandmother   . Hypertension Brother   . Breast cancer    . Breast cancer     Social History   Social History  . Marital status: Married    Spouse name: N/A  . Number of children: N/A  . Years of education: N/A   Occupational History  . Not on file.   Social History Main Topics  . Smoking status: Never Smoker  . Smokeless tobacco: Never Used  . Alcohol use No  . Drug use: No  . Sexual activity: Not on file   Other Topics Concern  . Not on file   Social History Narrative  . No narrative on file    Review of Systems: Constitutional: Negative Skin: Negative HENT: Negative  Eyes: Negative  Neck: Negative Respiratory: Negative Cardiovascular: Negative Gastrointestinal: Negative Genitourinary: Negative  Musculoskeletal: Negative   Neurological: Negative for Hematological: Negative  Psychiatric/Behavioral: Negative  Objective:   Vitals:   01/26/16 1135  BP: 128/88  Pulse: 86  Resp: 14  Temp: 98.2 F (36.8 C)    Physical Exam: Constitutional: Patient appears well-developed and well-nourished. No distress. HENT: Normocephalic, atraumatic, External right and left ear normal. Oropharynx is clear and moist.  Eyes: Conjunctivae and EOM are normal. PERRLA, no scleral icterus. Neck: Normal ROM. Neck supple. No lymphadenopathy, No thyromegaly. CVS: RRR, S1/S2 +, no murmurs, no gallops, no rubs Pulmonary: Effort and breath sounds normal, no  stridor, rhonchi, wheezes, rales.  Abdominal: Soft. Normoactive BS,, no distension, tenderness, rebound or guarding.  Musculoskeletal: Normal range of motion. No edema and no tenderness.  Neuro: Alert.Normal muscle tone coordination. Non-focal Skin: Skin is warm and dry. No rash noted. Not diaphoretic. No erythema. No pallor. Psychiatric: Normal mood and affect. Behavior, judgment, thought content normal.  Lab Results  Component Value Date   WBC 5.2 01/26/2016   HGB 14.5 01/26/2016   HCT 42.5 01/26/2016   MCV 84.2 01/26/2016   PLT 374 01/26/2016   Lab Results  Component Value Date   CREATININE 0.90 01/26/2016   BUN 14 01/26/2016   NA 135 01/26/2016   K 3.8 01/26/2016   CL 101 01/26/2016   CO2 23 01/26/2016    Lab Results  Component Value Date   HGBA1C 5.9 (H) 05/02/2015   Lipid Panel  No results found for: CHOL, TRIG, HDL, CHOLHDL, VLDL, LDLCALC      Assessment and plan:   1. Right-sided low back pain with right-sided sciatica, unspecified chronicity  - Acetaminophen-Codeine (TYLENOL/CODEINE #3) 300-30 MG tablet; Take 1 tablet by mouth every 4 (four) hours as needed for pain.  Dispense: 30 tablet; Refill: 0  2. Essential hypertension  - COMPLETE METABOLIC PANEL WITH GFR - CBC with Differential -Refill of metoprolol and hctz  3. PCOS -Refill metformin  4. Leg pain -Refill gabapentin.    Return in about 6 months (around 07/25/2016).  The patient was given clear instructions to go to ER or return to medical center if symptoms don't improve, worsen or new problems develop. The patient verbalized understanding.    Micheline Chapman FNP  01/28/2016, 10:44 AM

## 2016-02-02 ENCOUNTER — Ambulatory Visit: Payer: No Typology Code available for payment source

## 2016-02-09 ENCOUNTER — Ambulatory Visit: Payer: No Typology Code available for payment source

## 2016-02-16 ENCOUNTER — Ambulatory Visit: Payer: No Typology Code available for payment source

## 2016-02-24 ENCOUNTER — Ambulatory Visit: Payer: No Typology Code available for payment source | Admitting: Family Medicine

## 2016-02-26 MED FILL — ?METFORMIN HCL 500MG TABLET: 500 | 30 days supply | Qty: 30 | Fill #0

## 2016-02-26 MED FILL — *ESTRADIOL 2 MG TAB: 2 | 30 days supply | Qty: 30 | Fill #0

## 2016-02-26 MED FILL — METOPROLOL SUCC ER 50 MG TA: 50 | 30 days supply | Qty: 30 | Fill #0

## 2016-02-26 MED FILL — ACETAMINOPHEN/COD #3 TABLET: 300-30 | 5 days supply | Qty: 30 | Fill #0

## 2016-03-15 ENCOUNTER — Ambulatory Visit: Payer: No Typology Code available for payment source | Attending: Internal Medicine

## 2016-03-29 MED FILL — ?METFORMIN HCL 500MG TABLET: 500 | 30 days supply | Qty: 30 | Fill #1

## 2016-03-29 MED FILL — METOPROLOL SUCC ER 50 MG TA: 50 | 30 days supply | Qty: 30 | Fill #1

## 2016-03-29 MED FILL — TRIAMTERENE/HCTZ 37.5/25 TB: 37.5-25 | 30 days supply | Qty: 30 | Fill #1

## 2016-04-05 ENCOUNTER — Ambulatory Visit: Payer: No Typology Code available for payment source | Admitting: Family Medicine

## 2016-04-26 MED FILL — TRIAMTERENE/HCTZ 37.5/25 TB: 37.5-25 | 30 days supply | Qty: 30 | Fill #2

## 2016-04-26 MED FILL — ?METRONIDAZOLE 500 MG TABLE: 500 | 10 days supply | Qty: 30 | Fill #1

## 2016-04-26 MED FILL — METOPROLOL SUCC ER 50 MG TA: 50 | 30 days supply | Qty: 30 | Fill #2

## 2016-04-26 MED FILL — ?METFORMIN HCL 500MG TABLET: 500 | 30 days supply | Qty: 30 | Fill #2

## 2016-05-21 MED FILL — ?TRIAMTERENE/HCTZ 37.5/25TB: 37.5-25 | 30 days supply | Qty: 30 | Fill #3

## 2016-05-21 MED FILL — ?METOPROLOL SUCC ER 50 MG: 50 MG | 30 days supply | Qty: 30 | Fill #3

## 2016-06-08 MED FILL — metFORMIN HCL 500 MG TABS: 500 | 30 days supply | Qty: 30 | Fill #3

## 2016-06-25 ENCOUNTER — Other Ambulatory Visit: Payer: Self-pay | Admitting: Family Medicine

## 2016-06-25 DIAGNOSIS — E282 Polycystic ovarian syndrome: Secondary | ICD-10-CM

## 2016-06-25 DIAGNOSIS — I10 Essential (primary) hypertension: Secondary | ICD-10-CM

## 2016-06-25 DIAGNOSIS — M5441 Lumbago with sciatica, right side: Secondary | ICD-10-CM

## 2016-06-25 MED FILL — GABAPENTIN 100 MG CAPSULE: 100 | 30 days supply | Qty: 90 | Fill #0

## 2016-06-28 ENCOUNTER — Other Ambulatory Visit: Payer: Self-pay | Admitting: Family Medicine

## 2016-06-28 DIAGNOSIS — I10 Essential (primary) hypertension: Secondary | ICD-10-CM

## 2016-06-28 MED FILL — METOPROLOL SUCC ER 50 MG TA: 50 | 30 days supply | Qty: 30 | Fill #0

## 2016-06-28 MED FILL — TRIAMTERENE/HCTZ 37.5/25 TB: 37.5-25 | 30 days supply | Qty: 30 | Fill #0

## 2016-07-19 MED FILL — METOPROLOL SUCC ER 50 MG TA: 50 | 30 days supply | Qty: 30 | Fill #1

## 2016-07-19 MED FILL — ?METFORMIN HCL 500MG TABLET: 500 | 30 days supply | Qty: 30 | Fill #0

## 2016-07-19 MED FILL — TRIAMTERENE/HCTZ 37.5/25 TB: 37.5-25 | 30 days supply | Qty: 30 | Fill #0

## 2016-07-26 ENCOUNTER — Ambulatory Visit: Payer: No Typology Code available for payment source | Admitting: Family Medicine

## 2016-08-23 MED FILL — TRIAMTERENE-HCTZ 37.5-25 MG: 37.5-25 | 30 days supply | Qty: 30 | Fill #1

## 2016-08-23 MED FILL — METOPROLOL SUCC ER 50 MG TA: 50 | 30 days supply | Qty: 30 | Fill #2

## 2016-09-01 DIAGNOSIS — H9203 Otalgia, bilateral: Secondary | ICD-10-CM | POA: Diagnosis not present

## 2016-09-01 DIAGNOSIS — Z6841 Body Mass Index (BMI) 40.0 and over, adult: Secondary | ICD-10-CM | POA: Diagnosis not present

## 2016-09-21 ENCOUNTER — Other Ambulatory Visit: Payer: Self-pay | Admitting: Family Medicine

## 2016-09-21 DIAGNOSIS — I10 Essential (primary) hypertension: Secondary | ICD-10-CM

## 2016-09-21 MED FILL — METOPROLOL SUCC ER 50 MG TA: 50 | 30 days supply | Qty: 30 | Fill #3

## 2016-09-21 MED FILL — ?TRIAMTERENE/HCTZ 37.5/25TB: 37.5-25 | 30 days supply | Qty: 30 | Fill #2

## 2016-09-30 ENCOUNTER — Other Ambulatory Visit: Payer: Self-pay | Admitting: Internal Medicine

## 2016-09-30 ENCOUNTER — Other Ambulatory Visit: Payer: No Typology Code available for payment source

## 2016-09-30 DIAGNOSIS — B353 Tinea pedis: Secondary | ICD-10-CM | POA: Diagnosis not present

## 2016-09-30 DIAGNOSIS — R109 Unspecified abdominal pain: Secondary | ICD-10-CM | POA: Diagnosis not present

## 2016-09-30 DIAGNOSIS — M79604 Pain in right leg: Secondary | ICD-10-CM | POA: Diagnosis not present

## 2016-09-30 DIAGNOSIS — M7989 Other specified soft tissue disorders: Secondary | ICD-10-CM

## 2016-09-30 DIAGNOSIS — R739 Hyperglycemia, unspecified: Secondary | ICD-10-CM | POA: Diagnosis not present

## 2016-09-30 DIAGNOSIS — I10 Essential (primary) hypertension: Secondary | ICD-10-CM | POA: Diagnosis not present

## 2016-09-30 DIAGNOSIS — F419 Anxiety disorder, unspecified: Secondary | ICD-10-CM | POA: Diagnosis not present

## 2016-10-06 ENCOUNTER — Other Ambulatory Visit: Payer: No Typology Code available for payment source

## 2016-10-06 MED FILL — METOPROLOL SUCC ER 50 MG TA: 50 | 30 days supply | Qty: 30 | Fill #0

## 2016-10-06 MED FILL — metFORMIN HCL 500 MG TABS: 500 | 30 days supply | Qty: 60 | Fill #0

## 2016-10-07 ENCOUNTER — Other Ambulatory Visit: Payer: No Typology Code available for payment source

## 2016-10-07 DIAGNOSIS — E7439 Other disorders of intestinal carbohydrate absorption: Secondary | ICD-10-CM | POA: Diagnosis not present

## 2016-10-07 DIAGNOSIS — B351 Tinea unguium: Secondary | ICD-10-CM | POA: Diagnosis not present

## 2016-10-07 DIAGNOSIS — M79604 Pain in right leg: Secondary | ICD-10-CM | POA: Diagnosis not present

## 2016-10-08 ENCOUNTER — Ambulatory Visit (HOSPITAL_COMMUNITY): Payer: Medicare Other

## 2016-10-11 ENCOUNTER — Encounter (HOSPITAL_COMMUNITY): Payer: No Typology Code available for payment source

## 2016-10-12 ENCOUNTER — Ambulatory Visit (HOSPITAL_COMMUNITY): Admission: RE | Admit: 2016-10-12 | Payer: Medicare Other | Source: Ambulatory Visit

## 2016-10-15 ENCOUNTER — Ambulatory Visit (HOSPITAL_COMMUNITY): Payer: Medicare Other | Attending: Internal Medicine

## 2016-10-18 MED FILL — ?TRIAMTERENE/HCTZ 37.5/25TB: 37.5-25 | 30 days supply | Qty: 30 | Fill #0

## 2016-10-18 MED FILL — CIPROFLOXACIN HCL 500 MG TA: 500 | 10 days supply | Qty: 20 | Fill #0

## 2016-10-18 MED FILL — metroNIDAZOLE 500 MG TABS: 500 | 10 days supply | Qty: 30 | Fill #0

## 2016-10-20 ENCOUNTER — Ambulatory Visit (HOSPITAL_COMMUNITY): Payer: Medicare Other

## 2016-10-22 ENCOUNTER — Ambulatory Visit (HOSPITAL_COMMUNITY)
Admission: RE | Admit: 2016-10-22 | Discharge: 2016-10-22 | Disposition: A | Discharge status: Home / Self Care | Payer: Medicare Other | Source: Ambulatory Visit | Attending: Internal Medicine | Admitting: Internal Medicine

## 2016-10-22 DIAGNOSIS — M7989 Other specified soft tissue disorders: Secondary | ICD-10-CM | POA: Diagnosis not present

## 2016-10-22 NOTE — Progress Notes (Signed)
**  Preliminary report by tech**  Right lower extremity venous duplex complete. There is no evidence of deep or superficial vein thrombosis involving the right lower extremity. All visualized vessels appear patent and compressible. There is no evidence of a Baker's cyst on the right. Results were faxed to Jani Gravel.  10/22/16 1:38 PM Kelsey Ortega RVT

## 2016-11-24 MED FILL — METOPROLOL SUCC ER 50 MG TA: 50 | 30 days supply | Qty: 30 | Fill #0

## 2016-11-24 MED FILL — ?TRIAMTERENE/HCTZ 37.5/25TB: 37.5-25 | 30 days supply | Qty: 30 | Fill #1

## 2016-11-25 DIAGNOSIS — Z86010 Personal history of colonic polyps: Secondary | ICD-10-CM | POA: Diagnosis not present

## 2016-11-25 DIAGNOSIS — K5730 Diverticulosis of large intestine without perforation or abscess without bleeding: Secondary | ICD-10-CM | POA: Diagnosis not present

## 2016-11-25 DIAGNOSIS — R140 Abdominal distension (gaseous): Secondary | ICD-10-CM | POA: Diagnosis not present

## 2016-11-25 DIAGNOSIS — Z1211 Encounter for screening for malignant neoplasm of colon: Secondary | ICD-10-CM | POA: Diagnosis not present

## 2016-12-08 ENCOUNTER — Institutional Professional Consult (permissible substitution): Payer: Medicare Other | Admitting: Licensed Clinical Social Worker

## 2016-12-29 DIAGNOSIS — M25551 Pain in right hip: Secondary | ICD-10-CM | POA: Diagnosis not present

## 2016-12-29 DIAGNOSIS — M7662 Achilles tendinitis, left leg: Secondary | ICD-10-CM | POA: Diagnosis not present

## 2016-12-29 DIAGNOSIS — M25561 Pain in right knee: Secondary | ICD-10-CM | POA: Diagnosis not present

## 2017-01-03 ENCOUNTER — Other Ambulatory Visit: Payer: Self-pay | Admitting: Gastroenterology

## 2017-01-03 MED FILL — TRIAMTERENE/HCTZ 37.5/25 TB: 37.5-25 | 30 days supply | Qty: 30 | Fill #2

## 2017-01-03 MED FILL — METOPROLOL SUCC ER 50 MG TA: 50 | 30 days supply | Qty: 30 | Fill #1

## 2017-01-03 MED FILL — metFORMIN HCL 500 MG TABS: 500 | 30 days supply | Qty: 60 | Fill #0

## 2017-01-03 NOTE — Progress Notes (Signed)
Kelsey Quizon MD 

## 2017-01-06 ENCOUNTER — Encounter (HOSPITAL_COMMUNITY): Payer: Self-pay | Admitting: *Deleted

## 2017-01-07 ENCOUNTER — Encounter (HOSPITAL_COMMUNITY): Payer: Self-pay | Admitting: *Deleted

## 2017-01-11 MED FILL — GOLYTELY SOLUTION: 236 | 1 days supply | Qty: 4000 | Fill #0

## 2017-01-12 ENCOUNTER — Encounter (HOSPITAL_COMMUNITY): Payer: Self-pay | Admitting: Anesthesiology

## 2017-01-12 NOTE — Anesthesia Preprocedure Evaluation (Addendum)
Anesthesia Evaluation  Patient identified by MRN, date of birth, ID band Patient awake    Reviewed: Allergy & Precautions, NPO status , Patient's Chart, lab work & pertinent test results  Airway Mallampati: II  TM Distance: >3 FB Neck ROM: Full    Dental  (+) Caps, Teeth Intact   Pulmonary sleep apnea and Continuous Positive Airway Pressure Ventilation ,    Pulmonary exam normal breath sounds clear to auscultation       Cardiovascular hypertension, Pt. on medications and Pt. on home beta blockers Normal cardiovascular exam Rhythm:Regular Rate:Normal     Neuro/Psych  Headaches, PSYCHIATRIC DISORDERS Anxiety Depression    GI/Hepatic Neg liver ROS, For Screening colonoscopy   Endo/Other  PCOS  Renal/GU negative Renal ROS  negative genitourinary   Musculoskeletal  (+) Arthritis , Osteoarthritis,    Abdominal (+) + obese,   Peds  Hematology negative hematology ROS (+)   Anesthesia Other Findings   Reproductive/Obstetrics                            Anesthesia Physical Anesthesia Plan  ASA: III  Anesthesia Plan: MAC   Post-op Pain Management:    Induction: Intravenous  PONV Risk Score and Plan: 2 and Midazolam, Propofol infusion, Ondansetron and Treatment may vary due to age or medical condition  Airway Management Planned: Natural Airway, Nasal Cannula and Simple Face Mask  Additional Equipment:   Intra-op Plan: Utilization Of Total Body Hypothermia per surgeon request  Post-operative Plan:   Informed Consent: I have reviewed the patients History and Physical, chart, labs and discussed the procedure including the risks, benefits and alternatives for the proposed anesthesia with the patient or authorized representative who has indicated his/her understanding and acceptance.   Dental advisory given  Plan Discussed with: CRNA, Anesthesiologist and Surgeon  Anesthesia Plan Comments:         Anesthesia Quick Evaluation

## 2017-01-13 ENCOUNTER — Ambulatory Visit (HOSPITAL_COMMUNITY)
Admission: RE | Admit: 2017-01-13 | Discharge: 2017-01-13 | Disposition: A | Discharge status: Home / Self Care | Payer: Medicare Other | Source: Ambulatory Visit | Attending: Gastroenterology | Admitting: Gastroenterology

## 2017-01-13 ENCOUNTER — Ambulatory Visit (HOSPITAL_COMMUNITY): Payer: Medicare Other | Admitting: Anesthesiology

## 2017-01-13 ENCOUNTER — Encounter (HOSPITAL_COMMUNITY)
Admission: RE | Disposition: A | Discharge status: Home / Self Care | Payer: Self-pay | Source: Ambulatory Visit | Attending: Gastroenterology

## 2017-01-13 ENCOUNTER — Encounter (HOSPITAL_COMMUNITY): Payer: Self-pay

## 2017-01-13 DIAGNOSIS — Z880 Allergy status to penicillin: Secondary | ICD-10-CM | POA: Diagnosis not present

## 2017-01-13 DIAGNOSIS — Z7984 Long term (current) use of oral hypoglycemic drugs: Secondary | ICD-10-CM | POA: Diagnosis not present

## 2017-01-13 DIAGNOSIS — K5730 Diverticulosis of large intestine without perforation or abscess without bleeding: Secondary | ICD-10-CM | POA: Diagnosis not present

## 2017-01-13 DIAGNOSIS — D127 Benign neoplasm of rectosigmoid junction: Secondary | ICD-10-CM | POA: Diagnosis not present

## 2017-01-13 DIAGNOSIS — Z881 Allergy status to other antibiotic agents status: Secondary | ICD-10-CM | POA: Diagnosis not present

## 2017-01-13 DIAGNOSIS — Z79899 Other long term (current) drug therapy: Secondary | ICD-10-CM | POA: Diagnosis not present

## 2017-01-13 DIAGNOSIS — I10 Essential (primary) hypertension: Secondary | ICD-10-CM | POA: Insufficient documentation

## 2017-01-13 DIAGNOSIS — Z7982 Long term (current) use of aspirin: Secondary | ICD-10-CM | POA: Insufficient documentation

## 2017-01-13 DIAGNOSIS — Z888 Allergy status to other drugs, medicaments and biological substances status: Secondary | ICD-10-CM | POA: Diagnosis not present

## 2017-01-13 DIAGNOSIS — Z9989 Dependence on other enabling machines and devices: Secondary | ICD-10-CM | POA: Diagnosis not present

## 2017-01-13 DIAGNOSIS — F329 Major depressive disorder, single episode, unspecified: Secondary | ICD-10-CM | POA: Insufficient documentation

## 2017-01-13 DIAGNOSIS — Z6838 Body mass index (BMI) 38.0-38.9, adult: Secondary | ICD-10-CM | POA: Diagnosis not present

## 2017-01-13 DIAGNOSIS — E282 Polycystic ovarian syndrome: Secondary | ICD-10-CM | POA: Insufficient documentation

## 2017-01-13 DIAGNOSIS — G4730 Sleep apnea, unspecified: Secondary | ICD-10-CM | POA: Diagnosis not present

## 2017-01-13 DIAGNOSIS — M1990 Unspecified osteoarthritis, unspecified site: Secondary | ICD-10-CM | POA: Insufficient documentation

## 2017-01-13 DIAGNOSIS — Z1211 Encounter for screening for malignant neoplasm of colon: Secondary | ICD-10-CM | POA: Insufficient documentation

## 2017-01-13 DIAGNOSIS — K648 Other hemorrhoids: Secondary | ICD-10-CM | POA: Insufficient documentation

## 2017-01-13 DIAGNOSIS — Z91040 Latex allergy status: Secondary | ICD-10-CM | POA: Insufficient documentation

## 2017-01-13 DIAGNOSIS — K635 Polyp of colon: Secondary | ICD-10-CM | POA: Diagnosis not present

## 2017-01-13 DIAGNOSIS — E669 Obesity, unspecified: Secondary | ICD-10-CM | POA: Insufficient documentation

## 2017-01-13 DIAGNOSIS — F419 Anxiety disorder, unspecified: Secondary | ICD-10-CM | POA: Insufficient documentation

## 2017-01-13 DIAGNOSIS — K621 Rectal polyp: Secondary | ICD-10-CM | POA: Diagnosis not present

## 2017-01-13 DIAGNOSIS — D125 Benign neoplasm of sigmoid colon: Secondary | ICD-10-CM | POA: Diagnosis not present

## 2017-01-13 HISTORY — DX: Sleep apnea, unspecified: G47.30

## 2017-01-13 HISTORY — PX: COLONOSCOPY WITH PROPOFOL: SHX5780

## 2017-01-13 HISTORY — DX: Unspecified osteoarthritis, unspecified site: M19.90

## 2017-01-13 LAB — GLUCOSE, CAPILLARY: Glucose-Capillary: 101 mg/dL — ABNORMAL HIGH (ref 65–99)

## 2017-01-13 SURGERY — COLONOSCOPY WITH PROPOFOL
Anesthesia: Monitor Anesthesia Care

## 2017-01-13 MED ORDER — SODIUM CHLORIDE 0.9 % IV SOLN: INTRAVENOUS | Status: DC

## 2017-01-13 MED ORDER — PROPOFOL 10 MG/ML IV BOLUS
INTRAVENOUS | Status: DC | PRN
  Administered 2017-01-13: 40 mg via INTRAVENOUS
  Administered 2017-01-13 (×2): 20 mg via INTRAVENOUS
  Administered 2017-01-13: 10 mg via INTRAVENOUS
  Administered 2017-01-13 (×2): 20 mg via INTRAVENOUS
  Administered 2017-01-13 (×6): 10 mg via INTRAVENOUS
  Administered 2017-01-13: 20 mg via INTRAVENOUS

## 2017-01-13 MED ORDER — FENTANYL CITRATE (PF) 100 MCG/2ML IJ SOLN
50.0000 ug | Freq: Once | INTRAMUSCULAR | Status: AC
  Administered 2017-01-13: 50 ug via INTRAVENOUS

## 2017-01-13 MED ORDER — PROPOFOL 10 MG/ML IV BOLUS
INTRAVENOUS | Status: AC
  Filled 2017-01-13: qty 40

## 2017-01-13 MED ORDER — LACTATED RINGERS IV SOLN
INTRAVENOUS | Status: DC
  Administered 2017-01-13: 1000 mL via INTRAVENOUS

## 2017-01-13 MED ORDER — FENTANYL CITRATE (PF) 100 MCG/2ML IJ SOLN
INTRAMUSCULAR | Status: AC
  Filled 2017-01-13: qty 2

## 2017-01-13 SURGICAL SUPPLY — 21 items

## 2017-01-13 NOTE — Anesthesia Postprocedure Evaluation (Signed)
Anesthesia Post Note  Patient: Kelsey Ortega  Procedure(s) Performed: COLONOSCOPY WITH PROPOFOL (N/A )     Patient location during evaluation: PACU Anesthesia Type: MAC Level of consciousness: awake and alert Pain management: pain level controlled Vital Signs Assessment: post-procedure vital signs reviewed and stable Respiratory status: spontaneous breathing, nonlabored ventilation and respiratory function stable Cardiovascular status: stable and blood pressure returned to baseline Postop Assessment: no apparent nausea or vomiting Anesthetic complications: no    Last Vitals:  Vitals:   01/13/17 0710  BP: (!) 151/85  Pulse: 83  Resp: 15  Temp: 36.8 C  SpO2: 97%    Last Pain:  Vitals:   01/13/17 0710  TempSrc: Oral                 Brookie Wayment A.

## 2017-01-13 NOTE — Discharge Instructions (Signed)
Monitored Anesthesia Care, Care After These instructions provide you with information about caring for yourself after your procedure. Your health care provider may also give you more specific instructions. Your treatment has been planned according to current medical practices, but problems sometimes occur. Call your health care provider if you have any problems or questions after your procedure. What can I expect after the procedure? After your procedure, it is common to:  Feel sleepy for several hours.  Feel clumsy and have poor balance for several hours.  Feel forgetful about what happened after the procedure.  Have poor judgment for several hours.  Feel nauseous or vomit.  Have a sore throat if you had a breathing tube during the procedure.  Follow these instructions at home: For at least 24 hours after the procedure:   Do not: ? Participate in activities in which you could fall or become injured. ? Drive. ? Use heavy machinery. ? Drink alcohol. ? Take sleeping pills or medicines that cause drowsiness. ? Make important decisions or sign legal documents. ? Take care of children on your own.  Rest. Eating and drinking  Follow the diet that is recommended by your health care provider.  If you vomit, drink water, juice, or soup when you can drink without vomiting.  Make sure you have little or no nausea before eating solid foods. General instructions  Have a responsible adult stay with you until you are awake and alert.  Take over-the-counter and prescription medicines only as told by your health care provider.  If you smoke, do not smoke without supervision.  Keep all follow-up visits as told by your health care provider. This is important. Contact a health care provider if:  You keep feeling nauseous or you keep vomiting.  You feel light-headed.  You develop a rash.  You have a fever. Get help right away if:  You have trouble breathing. This information is  not intended to replace advice given to you by your health care provider. Make sure you discuss any questions you have with your health care provider. Document Released: 06/15/2015 Document Revised: 10/15/2015 Document Reviewed: 06/15/2015 Elsevier Interactive Patient Education  2018 Kelsey Ortega HAD AN ENDOSCOPIC PROCEDURE TODAY: Refer to the procedure report and other information in the discharge instructions given to you for any specific questions about what was found during the examination. If this information does not answer your questions, please call [facility] at [phone] to clarify.   YOU SHOULD EXPECT: Some feelings of bloating in the abdomen. Passage of more gas than usual. Walking can help get rid of the air that was put into your GI tract during the procedure and reduce the bloating. If you had a lower endoscopy (such as a colonoscopy or flexible sigmoidoscopy) you may notice spotting of blood in your stool or on the toilet paper. Some abdominal soreness may be present for a day or two, also.  DIET: Your first meal following the procedure should be a light meal and then it is ok to progress to your normal diet. A half-sandwich or bowl of soup is an example of a good first meal. Heavy or fried foods are harder to digest and may make you feel nauseous or bloated. Drink plenty of fluids but you should avoid alcoholic beverages for 24 hours. If you had an esophageal dilation, please see attached information for diet.   ACTIVITY: Your care partner should take you home directly after the procedure. You should plan to take  it easy, moving slowly for the rest of the day. You can resume normal activity the day after the procedure however YOU SHOULD NOT DRIVE, use power tools, machinery or perform tasks that involve climbing or major physical exertion for 24 hours (because of the sedation medicines used during the test).   SYMPTOMS TO REPORT IMMEDIATELY: A gastroenterologist can  be reached at any hour. Please call [phone]  for any of the following symptoms:  Following lower endoscopy (colonoscopy, flexible sigmoidoscopy) Excessive amounts of blood in the stool  Significant tenderness, worsening of abdominal pains  Swelling of the abdomen that is new, acute  Fever of 100 or higher  Following upper endoscopy (EGD, EUS, ERCP, esophageal dilation) Vomiting of blood or coffee ground material  New, significant abdominal pain  New, significant chest pain or pain under the shoulder blades  Painful or persistently difficult swallowing  New shortness of breath  Black, tarry-looking or red, bloody stools  FOLLOW UP:  If any biopsies were taken you will be contacted by phone or by letter within the next 1-3 weeks. Call [phone]  if you have not heard about the biopsies in 3 weeks.  Please also call with any specific questions about appointments or follow up tests.

## 2017-01-13 NOTE — Op Note (Signed)
[facility] Patient Name: Kelsey Ortega Procedure Date: 01/13/2017 MRN: [id number] Attending MD: Juanita Craver , MD Date of Birth: [birth year] CSN: [id number] Age: 56 Admit Type: Outpatient Procedure:                Colonoscopy with cold biopsies. Indications:              CRC screening for colorectal malignant neoplasm. Providers:                Juanita Craver, MD, Laverta Baltimore RN, RN, Alan Mulder, Technician, Longview Alday CRNA Referring MD:             Arlyss Repress, MD Medicines:                Monitored Anesthesia Care Complications:            No immediate complications. Estimated Blood Loss:     Estimated blood loss: minimal. Procedure:                Pre-anesthesia assessment: - Prior to the                            procedure, a history and physical was performed,                            and patient medications and allergies were                            reviewed. The patient's tolerance of previous                            anesthesia was also reviewed. The risks and                            benefits of the procedure and the sedation options                            and risks were discussed with the patient. All                            questions were answered, and informed consent was                            obtained. Prior anticoagulants: The patient has                            taken aspirin, last dose was 5 days prior to                            procedure. ASA Grade Assessment: II - A patient                            with mild systemic disease. After reviewing the  risks and benefits, the patient was deemed in                            satisfactory condition to undergo the procedure.                            After obtaining informed consent, the colonoscope                            was passed under direct vision. Throughout the                            procedure, the  patient's blood pressure, pulse, and                            oxygen saturations were monitored continuously. The                            [id number] was introduced through the anus and                            advanced to the the terminal ileum, with                            identification of the appendiceal orifice and IC                            valve. The colonoscopy was performed without                            difficulty. The patient tolerated the procedure                            well. The quality of the bowel preparation was                            adequate. The ileocecal valve, the appendiceal                            orifice and the rectum were photographed. The bowel                            preparation used was GoLYTELY. Scope In: 7:30:07 AM Scope Out: 7:42:24 AM Scope Withdrawal Time: 0 hours 5 minutes 51 seconds  Total Procedure Duration: 0 hours 12 minutes 17 seconds  Findings:      Multiple small and large-mouthed diverticula were found in the entire       colon; there was no evidence of diverticular bleeding.      Four small sessile polyps were found in the recto-sigmoid colon-these       were removed by cold biopsies.      Small internal hemorrhoids were noted on retroflexion.      The terminal ileum appeared normal. Impression:               -  Moderate diverticulosis in the entire examined                            colon; there was no evidence of diverticular                            bleeding.                           - Four small sessile polyps at the recto-sigmoid                            colon-removed by cold biopsies.                           - Small internal hemorrhoids. Moderate Sedation:      MAC given. Recommendation:           - High fiber diet with augmented water consumption                            daily.                           - Continue present medications.                           - Await pathology results.                            - Repeat colonoscopy in 5-10 years for surveillance.                           - Return to GI office PRN.                           - If the patient has any abnormal GI symptoms in                            the interim, she has been advised to call the                            office ASAP for further recommendations. Procedure Code(s):        --- Professional ---                           [phone], Colonoscopy, flexible; with biopsy, single                            or multiple Diagnosis Code(s):        --- Professional ---                           D12.7, Benign neoplasm of rectosigmoid junction                           K57.30, Diverticulosis of large intestine without  perforation or abscess without bleeding                           Z12.11, Encounter for screening for malignant                            neoplasm of colon CPT copyright 2016 American Medical Association. All rights reserved. The codes documented in this report are preliminary and upon coder review may  be revised to meet current compliance requirements. Juanita Craver, MD Juanita Craver, MD 01/13/2017 8:00:26 AM This report has been signed electronically. Number of Addenda: 0

## 2017-01-13 NOTE — Transfer of Care (Signed)
Immediate Anesthesia Transfer of Care Note  Patient: Kelsey Ortega  Procedure(s) Performed: COLONOSCOPY WITH PROPOFOL (N/A )  Patient Location: PACU  Anesthesia Type:MAC  Level of Consciousness: sedated  Airway & Oxygen Therapy: Patient Spontanous Breathing and Patient connected to nasal cannula oxygen  Post-op Assessment: Report given to RN and Post -op Vital signs reviewed and stable  Post vital signs: Reviewed and stable  Last Vitals:  Vitals:   01/13/17 0710  BP: (!) 151/85  Pulse: 83  Resp: 15  Temp: 36.8 C  SpO2: 97%    Last Pain:  Vitals:   01/13/17 0710  TempSrc: Oral         Complications: No apparent anesthesia complications

## 2017-01-13 NOTE — H&P (Signed)
Kelsey Ortega is an 56 y.o. female.   Chief Complaint: Colorectal cancer screening.  HPI: 56 year old black female here for a screening colonoscopy. See office notes for details.  Past Medical History:  Diagnosis Date  . Arthritis   . Headaches, cluster   . Hypertension   . Menorrhagia   . PCOS (polycystic ovarian syndrome)   . Right knee pain   . Sleep apnea    no cpap   Past Surgical History:  Procedure Laterality Date  . CYSTECTOMY  2008  . TUBAL LIGATION  1993   Family History  Problem Relation Age of Onset  . Hypertension Maternal Grandmother   . Hypertension Brother   . Breast cancer Unknown   . Breast cancer Unknown    Social History:  reports that  has never smoked. she has never used smokeless tobacco. She reports that she does not drink alcohol or use drugs.  Allergies:  Allergies  Allergen Reactions  . Augmentin [Amoxicillin-Pot Clavulanate] Diarrhea  . Biaxin [Clarithromycin] Itching  . Limbitrol Ds [Chlordiazepoxide-Amitriptyline] Other (See Comments)    groggy  . Penicillins Other (See Comments)    Hives, headache, vomiting  . Latex Rash  . Sudafed [Pseudoephedrine Hcl] Palpitations   Medications Prior to Admission  Medication Sig Dispense Refill  . aspirin EC 81 MG tablet Take 81 mg daily by mouth.     . B Complex-C-E-Zn (B COMPLEX-C-E-ZINC) tablet Take 1 tablet daily as needed by mouth.    . Calcium Carbonate-Vitamin D (CALTRATE 600+D PO) Take 1 tablet daily by mouth.    . diazepam (VALIUM) 10 MG tablet Take 10 mg every 12 (twelve) hours as needed by mouth for anxiety.    Marland Kitchen ibuprofen (ADVIL,MOTRIN) 600 MG tablet Take 600 mg every 6 (six) hours as needed by mouth for mild pain or moderate pain.    . metFORMIN (GLUCOPHAGE) 1000 MG tablet Take 2,000 mg 2 (two) times daily with a meal by mouth.     . metoprolol succinate (TOPROL-XL) 50 MG 24 hr tablet TAKE 1 TABLET BY MOUTH DAILY. TAKE WITH OR IMMEDIATELY FOLLOWING A MEAL. 30 tablet 3  . Multiple  Vitamins-Minerals (EYE VITAMINS & MINERALS) TABS Take 1 tablet 2 (two) times a week by mouth.    . naproxen (NAPROSYN) 500 MG tablet Take 500 mg by mouth 2 (two) times daily. Every 12 hours as needed    . Probiotic Product (PROBIOTIC DAILY PO) Take 1 capsule daily by mouth.    . triamterene-hydrochlorothiazide (MAXZIDE-25) 37.5-25 MG tablet TAKE 1 TABLET BY MOUTH ONCE DAILY IN MORNING 30 tablet 0  . vitamin C (ASCORBIC ACID) 500 MG tablet Take 500 mg 2 (two) times a week by mouth.     Review of Systems  Constitutional: Negative.   HENT: Negative.   Eyes: Negative.   Respiratory: Negative.   Cardiovascular: Negative.   Gastrointestinal: Positive for abdominal pain. Negative for blood in stool, constipation, diarrhea, heartburn, melena, nausea and vomiting.  Genitourinary: Negative.   Musculoskeletal: Positive for back pain and joint pain.  Skin: Negative.   Neurological: Positive for headaches.  Endo/Heme/Allergies: Negative.   Psychiatric/Behavioral: Positive for depression. Negative for hallucinations, memory loss, substance abuse and suicidal ideas. The patient is nervous/anxious. The patient does not have insomnia.    Last menstrual period 12/20/2016. Physical Exam  Constitutional: She is oriented to person, place, and time. She appears well-developed and well-nourished.  Morbidly obese  HENT:  Head: Normocephalic and atraumatic.  Eyes: Conjunctivae and EOM  are normal.  Neck: Neck supple.  Cardiovascular: Normal rate and regular rhythm.  Respiratory: Effort normal and breath sounds normal.  GI: Soft. Bowel sounds are normal.  Musculoskeletal: Normal range of motion.  Neurological: She is alert and oriented to person, place, and time.  Skin: Skin is warm and dry.  Psychiatric: She has a normal mood and affect. Her behavior is normal. Thought content normal.    Assessment/Plan Colorectal cancer screening: proceed with a colonoscopy at this time.  Eliakim Tendler, MD 01/13/2017,  7:02 AM

## 2017-01-14 ENCOUNTER — Encounter (HOSPITAL_COMMUNITY): Payer: Self-pay | Admitting: Gastroenterology

## 2017-01-25 MED FILL — METOPROLOL SUCC ER 50 MG TA: 50 | 30 days supply | Qty: 30 | Fill #2

## 2017-01-25 MED FILL — ?TRIAMTERENE/HCTZ 37.5/25TB: 37.5-25 | 30 days supply | Qty: 30 | Fill #3

## 2017-02-24 DIAGNOSIS — E7439 Other disorders of intestinal carbohydrate absorption: Secondary | ICD-10-CM | POA: Diagnosis not present

## 2017-02-24 DIAGNOSIS — E119 Type 2 diabetes mellitus without complications: Secondary | ICD-10-CM | POA: Diagnosis not present

## 2017-03-11 DIAGNOSIS — E669 Obesity, unspecified: Secondary | ICD-10-CM | POA: Diagnosis not present

## 2017-03-11 DIAGNOSIS — H6690 Otitis media, unspecified, unspecified ear: Secondary | ICD-10-CM | POA: Diagnosis not present

## 2017-03-11 DIAGNOSIS — R1031 Right lower quadrant pain: Secondary | ICD-10-CM | POA: Diagnosis not present

## 2017-03-11 DIAGNOSIS — K7689 Other specified diseases of liver: Secondary | ICD-10-CM | POA: Diagnosis not present

## 2017-03-14 ENCOUNTER — Other Ambulatory Visit (HOSPITAL_COMMUNITY): Payer: Self-pay | Admitting: Internal Medicine

## 2017-03-14 ENCOUNTER — Other Ambulatory Visit: Payer: Self-pay | Admitting: Internal Medicine

## 2017-03-14 DIAGNOSIS — R945 Abnormal results of liver function studies: Secondary | ICD-10-CM

## 2017-03-14 DIAGNOSIS — R1031 Right lower quadrant pain: Secondary | ICD-10-CM

## 2017-03-17 ENCOUNTER — Encounter (HOSPITAL_COMMUNITY): Payer: Self-pay

## 2017-03-17 ENCOUNTER — Ambulatory Visit (HOSPITAL_COMMUNITY): Payer: Medicare Other

## 2017-03-24 ENCOUNTER — Ambulatory Visit (HOSPITAL_COMMUNITY)
Admission: RE | Admit: 2017-03-24 | Discharge: 2017-03-24 | Disposition: A | Discharge status: Home / Self Care | Payer: Medicare Other | Source: Ambulatory Visit | Attending: Internal Medicine | Admitting: Internal Medicine

## 2017-03-24 DIAGNOSIS — R1031 Right lower quadrant pain: Secondary | ICD-10-CM | POA: Insufficient documentation

## 2017-03-24 DIAGNOSIS — Z9049 Acquired absence of other specified parts of digestive tract: Secondary | ICD-10-CM | POA: Diagnosis not present

## 2017-03-24 DIAGNOSIS — K760 Fatty (change of) liver, not elsewhere classified: Secondary | ICD-10-CM | POA: Insufficient documentation

## 2017-03-24 DIAGNOSIS — R945 Abnormal results of liver function studies: Secondary | ICD-10-CM

## 2017-03-24 DIAGNOSIS — N281 Cyst of kidney, acquired: Secondary | ICD-10-CM | POA: Diagnosis not present

## 2017-05-04 DIAGNOSIS — I10 Essential (primary) hypertension: Secondary | ICD-10-CM | POA: Diagnosis not present

## 2017-05-04 DIAGNOSIS — K760 Fatty (change of) liver, not elsewhere classified: Secondary | ICD-10-CM | POA: Diagnosis not present

## 2017-05-04 DIAGNOSIS — E7439 Other disorders of intestinal carbohydrate absorption: Secondary | ICD-10-CM | POA: Diagnosis not present

## 2017-05-04 DIAGNOSIS — K7689 Other specified diseases of liver: Secondary | ICD-10-CM | POA: Diagnosis not present

## 2017-08-02 ENCOUNTER — Other Ambulatory Visit: Payer: Self-pay | Admitting: Gastroenterology

## 2017-08-02 DIAGNOSIS — R1032 Left lower quadrant pain: Secondary | ICD-10-CM

## 2017-08-02 DIAGNOSIS — K5730 Diverticulosis of large intestine without perforation or abscess without bleeding: Secondary | ICD-10-CM | POA: Diagnosis not present

## 2017-08-02 DIAGNOSIS — R110 Nausea: Secondary | ICD-10-CM | POA: Diagnosis not present

## 2017-08-03 ENCOUNTER — Other Ambulatory Visit: Payer: Self-pay | Admitting: Gastroenterology

## 2017-08-03 ENCOUNTER — Encounter (HOSPITAL_COMMUNITY): Payer: Self-pay

## 2017-08-03 ENCOUNTER — Ambulatory Visit (HOSPITAL_COMMUNITY)
Admission: RE | Admit: 2017-08-03 | Discharge: 2017-08-03 | Disposition: A | Discharge status: Home / Self Care | Payer: Medicare Other | Source: Ambulatory Visit | Attending: Gastroenterology | Admitting: Gastroenterology

## 2017-08-03 DIAGNOSIS — N2889 Other specified disorders of kidney and ureter: Secondary | ICD-10-CM | POA: Diagnosis not present

## 2017-08-03 DIAGNOSIS — Z9049 Acquired absence of other specified parts of digestive tract: Secondary | ICD-10-CM | POA: Insufficient documentation

## 2017-08-03 DIAGNOSIS — R933 Abnormal findings on diagnostic imaging of other parts of digestive tract: Secondary | ICD-10-CM | POA: Diagnosis not present

## 2017-08-03 DIAGNOSIS — R1032 Left lower quadrant pain: Secondary | ICD-10-CM

## 2017-08-03 MED ORDER — IOPAMIDOL (ISOVUE-300) INJECTION 61%
INTRAVENOUS | Status: AC
  Filled 2017-08-03: qty 100

## 2017-08-03 MED ORDER — IOPAMIDOL (ISOVUE-300) INJECTION 61%
INTRAVENOUS | Status: AC
  Filled 2017-08-03: qty 30

## 2017-08-03 MED ORDER — IOPAMIDOL (ISOVUE-300) INJECTION 61%
30.0000 mL | Freq: Once | INTRAVENOUS | Status: AC | PRN
  Administered 2017-08-03: 30 mL via INTRAVENOUS

## 2017-08-03 MED ORDER — IOPAMIDOL (ISOVUE-300) INJECTION 61%
100.0000 mL | Freq: Once | INTRAVENOUS | Status: AC | PRN
Start: 2017-08-03 — End: 2017-08-03
  Administered 2017-08-03: 100 mL via INTRAVENOUS

## 2017-08-11 DIAGNOSIS — I10 Essential (primary) hypertension: Secondary | ICD-10-CM | POA: Diagnosis not present

## 2017-08-11 DIAGNOSIS — Z0000 Encounter for general adult medical examination without abnormal findings: Secondary | ICD-10-CM | POA: Diagnosis not present

## 2017-08-11 DIAGNOSIS — E7439 Other disorders of intestinal carbohydrate absorption: Secondary | ICD-10-CM | POA: Diagnosis not present

## 2017-08-11 DIAGNOSIS — R05 Cough: Secondary | ICD-10-CM | POA: Diagnosis not present

## 2017-08-11 DIAGNOSIS — K5792 Diverticulitis of intestine, part unspecified, without perforation or abscess without bleeding: Secondary | ICD-10-CM | POA: Diagnosis not present

## 2017-08-15 DIAGNOSIS — Z124 Encounter for screening for malignant neoplasm of cervix: Secondary | ICD-10-CM | POA: Diagnosis not present

## 2017-08-15 DIAGNOSIS — Z01419 Encounter for gynecological examination (general) (routine) without abnormal findings: Secondary | ICD-10-CM | POA: Diagnosis not present

## 2017-08-16 DIAGNOSIS — K5904 Chronic idiopathic constipation: Secondary | ICD-10-CM | POA: Diagnosis not present

## 2017-08-16 DIAGNOSIS — R140 Abdominal distension (gaseous): Secondary | ICD-10-CM | POA: Diagnosis not present

## 2017-08-16 DIAGNOSIS — K5730 Diverticulosis of large intestine without perforation or abscess without bleeding: Secondary | ICD-10-CM | POA: Diagnosis not present

## 2017-09-06 DIAGNOSIS — I10 Essential (primary) hypertension: Secondary | ICD-10-CM | POA: Diagnosis not present

## 2017-09-06 DIAGNOSIS — E7439 Other disorders of intestinal carbohydrate absorption: Secondary | ICD-10-CM | POA: Diagnosis not present

## 2017-09-13 DIAGNOSIS — K5904 Chronic idiopathic constipation: Secondary | ICD-10-CM | POA: Diagnosis not present

## 2017-09-13 DIAGNOSIS — R140 Abdominal distension (gaseous): Secondary | ICD-10-CM | POA: Diagnosis not present

## 2017-09-13 DIAGNOSIS — Z86010 Personal history of colonic polyps: Secondary | ICD-10-CM | POA: Diagnosis not present

## 2017-09-13 DIAGNOSIS — K601 Chronic anal fissure: Secondary | ICD-10-CM | POA: Diagnosis not present

## 2017-09-28 DIAGNOSIS — Z0000 Encounter for general adult medical examination without abnormal findings: Secondary | ICD-10-CM | POA: Diagnosis not present

## 2017-09-28 DIAGNOSIS — E7439 Other disorders of intestinal carbohydrate absorption: Secondary | ICD-10-CM | POA: Diagnosis not present

## 2017-09-28 DIAGNOSIS — L709 Acne, unspecified: Secondary | ICD-10-CM | POA: Diagnosis not present

## 2017-09-28 DIAGNOSIS — L819 Disorder of pigmentation, unspecified: Secondary | ICD-10-CM | POA: Diagnosis not present

## 2017-09-28 DIAGNOSIS — H9201 Otalgia, right ear: Secondary | ICD-10-CM | POA: Diagnosis not present

## 2017-09-28 DIAGNOSIS — M79672 Pain in left foot: Secondary | ICD-10-CM | POA: Diagnosis not present

## 2017-09-28 DIAGNOSIS — I10 Essential (primary) hypertension: Secondary | ICD-10-CM | POA: Diagnosis not present

## 2017-10-10 ENCOUNTER — Ambulatory Visit: Payer: Medicare Other | Admitting: Podiatry

## 2017-10-24 ENCOUNTER — Ambulatory Visit: Payer: Medicare Other | Admitting: Podiatry

## 2017-11-14 ENCOUNTER — Ambulatory Visit: Payer: Medicare Other | Admitting: Podiatry

## 2017-12-01 ENCOUNTER — Ambulatory Visit: Payer: Medicare Other | Admitting: Podiatry

## 2018-03-31 DIAGNOSIS — E7439 Other disorders of intestinal carbohydrate absorption: Secondary | ICD-10-CM | POA: Diagnosis not present

## 2018-03-31 DIAGNOSIS — Z131 Encounter for screening for diabetes mellitus: Secondary | ICD-10-CM | POA: Diagnosis not present

## 2018-03-31 DIAGNOSIS — I10 Essential (primary) hypertension: Secondary | ICD-10-CM | POA: Diagnosis not present

## 2018-04-14 DIAGNOSIS — F419 Anxiety disorder, unspecified: Secondary | ICD-10-CM | POA: Diagnosis not present

## 2018-04-14 DIAGNOSIS — E119 Type 2 diabetes mellitus without complications: Secondary | ICD-10-CM | POA: Diagnosis not present

## 2018-04-14 DIAGNOSIS — Z79899 Other long term (current) drug therapy: Secondary | ICD-10-CM | POA: Diagnosis not present

## 2018-04-14 DIAGNOSIS — M79604 Pain in right leg: Secondary | ICD-10-CM | POA: Diagnosis not present

## 2018-04-14 DIAGNOSIS — I10 Essential (primary) hypertension: Secondary | ICD-10-CM | POA: Diagnosis not present

## 2018-04-14 DIAGNOSIS — M549 Dorsalgia, unspecified: Secondary | ICD-10-CM | POA: Diagnosis not present

## 2018-06-13 DIAGNOSIS — R300 Dysuria: Secondary | ICD-10-CM | POA: Diagnosis not present
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
## 2018-08-31 ENCOUNTER — Encounter: Payer: Self-pay | Admitting: Emergency Medicine

## 2018-09-01 ENCOUNTER — Encounter: Payer: Self-pay | Admitting: Gastroenterology

## 2018-09-01 ENCOUNTER — Ambulatory Visit (INDEPENDENT_AMBULATORY_CARE_PROVIDER_SITE_OTHER): Payer: Medicare Other | Admitting: Gastroenterology

## 2018-09-01 VITALS — Ht 69.0 in | Wt 250.0 lb

## 2018-09-01 DIAGNOSIS — R194 Change in bowel habit: Secondary | ICD-10-CM | POA: Diagnosis not present

## 2018-09-01 DIAGNOSIS — K6289 Other specified diseases of anus and rectum: Secondary | ICD-10-CM

## 2018-09-01 DIAGNOSIS — K625 Hemorrhage of anus and rectum: Secondary | ICD-10-CM

## 2018-09-01 MED ORDER — AMBULATORY NON FORMULARY MEDICATION: 1 refills | Status: DC

## 2018-09-01 NOTE — Patient Instructions (Addendum)
I recommended a colonoscopy to evaluate your change in bowel habits, rectal pain, and bleeding.  Please continue to take Linzess, MiraLAX, and stool softeners per your current regimen.  A daily probiotic may be helpful.  In the meantime let us try empiric treatment for an anal fissure given your ongoing symptoms: 1. Eat 25-30 g of fiber in your diet through fruits, vegetables, and bran fibers 2. Sitz bath after a bowel movement will help with the pain (epsome salt in a bathtub with warm water, sit in the warm water 10 minutes up to twice a day as needed) 3. Citrucel 1 tblsp twice daily, titrate to a soft, formed stool 4. Miralax 1 capful daily if needed to keep stools soft 5. Limit your time on the toilet. Sit no longer than 5 minutes. Resist straining. 6. Before wiping after a bowel movement, swipe the paper into a jar of vaseline to give the paper a slight covering of the vaseline until fully healed. This will help reduce irriation and discomfort to the area. 7. Trial of Nitroglycerin 0.125% gel applied to the rectum TID x 6-8 week 8. Call if no improvement in 4 weeks  Please call with any questions or concerns in the meantime.   How to Take a [employer] A sitz bath is a warm water bath that may be used to care for your rectum, genital area, or the area between your rectum and genitals (perineum). For a sitz bath, the water only comes up to your hips and covers your buttocks. A sitz bath may done at home in a bathtub or with a portable sitz bath that fits over the toilet. Your health care provider may recommend a sitz bath to help:  Relieve pain and discomfort after delivering a baby.  Relieve pain and itching from hemorrhoids or anal fissures.  Relieve pain after certain surgeries.  Relax muscles that are sore or tight. How to take a sitz bath Take 3-4 sitz baths a day, or as many as told by your health care provider. Bathtub sitz bath To take a sitz bath in a  bathtub: 1. Partially fill a bathtub with warm water. The water should be deep enough to cover your hips and buttocks when you are sitting in the tub. 2. If your health care provider told you to put medicine in the water, follow his or her instructions. 3. Sit in the water. 4. Open the tub drain a little, and leave it open during your bath. 5. Turn on the warm water again, enough to replace the water that is draining out. Keep the water running throughout your bath. This helps keep the water at the right level and the right temperature. 6. Soak in the water for 15-20 minutes, or as long as told by your health care provider. 7. When you are done, be careful when you stand up. You may feel dizzy. 8. After the sitz bath, pat yourself dry. Do not rub your skin to dry it.  Over-the-toilet sitz bath To take a sitz bath with an over-the-toilet basin: 1. Follow the manufacturer's instructions. 2. Fill the basin with warm water. 3. If your health care provider told you to put medicine in the water, follow his or her instructions. 4. Sit on the seat. Make sure the water covers your buttocks and perineum. 5. Soak in the water for 15-20 minutes, or as long as told by your health care provider. 6. After the sitz bath, pat yourself dry. Do not rub your  skin to dry it. 7. Clean and dry the basin between uses. 8. Discard the basin if it cracks, or according to the manufacturer's instructions. Contact a health care provider if:  Your symptoms get worse. Do not continue with sitz baths if your symptoms get worse.  You have new symptoms. If this happens, do not continue with sitz baths until you talk with your health care provider. Summary  A sitz bath is a warm water bath in which the water only comes up to your hips and covers your buttocks.  A sitz bath may help relieve itching, relieve pain, and relax muscles that are sore or tight in the lower part of your body, including your genital area.  Take  3-4 sitz baths a day, or as many as told by your health care provider. Soak in the water for 15-20 minutes.  Do not continue with sitz baths if your symptoms get worse. This information is not intended to replace advice given to you by your health care provider. Make sure you discuss any questions you have with your health care provider. Document Released: 11/15/2003 Document Revised: 02/24/2017 Document Reviewed: 02/24/2017 Elsevier Patient Education  2020 [employer].

## 2018-09-13 ENCOUNTER — Telehealth: Payer: Self-pay | Admitting: Emergency Medicine

## 2018-09-13 NOTE — Telephone Encounter (Signed)
Please schedule. Thank you

## 2018-09-13 NOTE — Telephone Encounter (Signed)
-----   Message from Osvaldo Angst, CRNA sent at 09/11/2018  8:15 PM EDT ----- York Cerise,  This pt is cleared for anesthetic care at [facility].   Thanks,  Osvaldo Angst ----- Message ----- From: Wyline Beady, CMA Sent: 09/11/2018  10:05 AM EDT To: Osvaldo Angst, CRNA  Hi John,   Will you please look at this patients chart she is requesting to be scoped at the hospital because she has sleep apnea, she does not require use a CPAP machine to sleep she just sleeps on two pillows.   Thanks,   [employer]

## 2018-09-13 NOTE — Telephone Encounter (Signed)
Left message for patient to call back to schedule colonoscopy.

## 2018-09-19 NOTE — Telephone Encounter (Signed)
Left message on patients voicemail to call office.   

## 2018-09-20 DIAGNOSIS — K219 Gastro-esophageal reflux disease without esophagitis: Secondary | ICD-10-CM | POA: Diagnosis not present

## 2018-09-20 DIAGNOSIS — M26623 Arthralgia of bilateral temporomandibular joint: Secondary | ICD-10-CM | POA: Diagnosis not present

## 2018-09-20 DIAGNOSIS — Z6838 Body mass index (BMI) 38.0-38.9, adult: Secondary | ICD-10-CM | POA: Diagnosis not present

## 2018-09-20 DIAGNOSIS — G4733 Obstructive sleep apnea (adult) (pediatric): Secondary | ICD-10-CM | POA: Diagnosis not present

## 2018-09-20 DIAGNOSIS — I10 Essential (primary) hypertension: Secondary | ICD-10-CM | POA: Diagnosis not present

## 2018-09-20 DIAGNOSIS — J358 Other chronic diseases of tonsils and adenoids: Secondary | ICD-10-CM | POA: Diagnosis not present

## 2018-09-26 DIAGNOSIS — Z1231 Encounter for screening mammogram for malignant neoplasm of breast: Secondary | ICD-10-CM | POA: Diagnosis not present

## 2018-09-27 ENCOUNTER — Other Ambulatory Visit: Payer: Self-pay | Admitting: Obstetrics & Gynecology

## 2018-09-27 DIAGNOSIS — R928 Other abnormal and inconclusive findings on diagnostic imaging of breast: Secondary | ICD-10-CM

## 2018-09-29 ENCOUNTER — Ambulatory Visit
Admission: RE | Admit: 2018-09-29 | Discharge: 2018-09-29 | Disposition: A | Discharge status: Home / Self Care | Payer: Medicare Other | Source: Ambulatory Visit | Attending: Obstetrics & Gynecology | Admitting: Obstetrics & Gynecology

## 2018-09-29 ENCOUNTER — Other Ambulatory Visit: Payer: Self-pay

## 2018-09-29 DIAGNOSIS — R928 Other abnormal and inconclusive findings on diagnostic imaging of breast: Secondary | ICD-10-CM

## 2018-09-29 DIAGNOSIS — N6012 Diffuse cystic mastopathy of left breast: Secondary | ICD-10-CM | POA: Diagnosis not present

## 2018-10-04 ENCOUNTER — Encounter (HOSPITAL_BASED_OUTPATIENT_CLINIC_OR_DEPARTMENT_OTHER): Payer: Self-pay

## 2018-10-04 DIAGNOSIS — G4733 Obstructive sleep apnea (adult) (pediatric): Secondary | ICD-10-CM

## 2018-10-13 DIAGNOSIS — M549 Dorsalgia, unspecified: Secondary | ICD-10-CM | POA: Diagnosis not present

## 2018-10-13 DIAGNOSIS — R739 Hyperglycemia, unspecified: Secondary | ICD-10-CM | POA: Diagnosis not present

## 2018-10-13 DIAGNOSIS — I10 Essential (primary) hypertension: Secondary | ICD-10-CM | POA: Diagnosis not present

## 2018-10-13 DIAGNOSIS — E7439 Other disorders of intestinal carbohydrate absorption: Secondary | ICD-10-CM | POA: Diagnosis not present

## 2018-10-13 DIAGNOSIS — Z0000 Encounter for general adult medical examination without abnormal findings: Secondary | ICD-10-CM | POA: Diagnosis not present

## 2018-11-10 DIAGNOSIS — E669 Obesity, unspecified: Secondary | ICD-10-CM | POA: Diagnosis not present

## 2018-11-10 DIAGNOSIS — E282 Polycystic ovarian syndrome: Secondary | ICD-10-CM | POA: Diagnosis not present

## 2018-11-10 DIAGNOSIS — R7303 Prediabetes: Secondary | ICD-10-CM | POA: Diagnosis not present

## 2018-11-10 DIAGNOSIS — I10 Essential (primary) hypertension: Secondary | ICD-10-CM | POA: Diagnosis not present

## 2018-11-10 DIAGNOSIS — E785 Hyperlipidemia, unspecified: Secondary | ICD-10-CM | POA: Diagnosis not present

## 2018-11-10 DIAGNOSIS — Z6838 Body mass index (BMI) 38.0-38.9, adult: Secondary | ICD-10-CM | POA: Diagnosis not present

## 2018-11-24 ENCOUNTER — Encounter: Payer: Self-pay | Admitting: Gastroenterology

## 2018-11-24 DIAGNOSIS — E119 Type 2 diabetes mellitus without complications: Secondary | ICD-10-CM | POA: Diagnosis not present

## 2018-11-24 DIAGNOSIS — L709 Acne, unspecified: Secondary | ICD-10-CM | POA: Diagnosis not present

## 2018-11-24 DIAGNOSIS — F341 Dysthymic disorder: Secondary | ICD-10-CM | POA: Diagnosis not present

## 2018-11-24 DIAGNOSIS — I10 Essential (primary) hypertension: Secondary | ICD-10-CM | POA: Diagnosis not present

## 2018-11-24 DIAGNOSIS — Z13220 Encounter for screening for lipoid disorders: Secondary | ICD-10-CM | POA: Diagnosis not present

## 2018-11-24 DIAGNOSIS — K5792 Diverticulitis of intestine, part unspecified, without perforation or abscess without bleeding: Secondary | ICD-10-CM | POA: Diagnosis not present

## 2018-11-24 DIAGNOSIS — J019 Acute sinusitis, unspecified: Secondary | ICD-10-CM | POA: Diagnosis not present

## 2018-11-24 DIAGNOSIS — R5383 Other fatigue: Secondary | ICD-10-CM | POA: Diagnosis not present

## 2018-12-11 ENCOUNTER — Ambulatory Visit (HOSPITAL_BASED_OUTPATIENT_CLINIC_OR_DEPARTMENT_OTHER): Payer: Medicare Other | Attending: Otolaryngology | Admitting: Internal Medicine

## 2019-01-07 DIAGNOSIS — S61213A Laceration without foreign body of left middle finger without damage to nail, initial encounter: Secondary | ICD-10-CM | POA: Diagnosis not present

## 2019-01-11 DIAGNOSIS — B379 Candidiasis, unspecified: Secondary | ICD-10-CM | POA: Diagnosis not present

## 2019-01-11 DIAGNOSIS — R05 Cough: Secondary | ICD-10-CM | POA: Diagnosis not present

## 2019-01-31 ENCOUNTER — Encounter (HOSPITAL_BASED_OUTPATIENT_CLINIC_OR_DEPARTMENT_OTHER): Payer: Medicare Other | Admitting: Internal Medicine

## 2019-02-07 ENCOUNTER — Encounter (HOSPITAL_BASED_OUTPATIENT_CLINIC_OR_DEPARTMENT_OTHER): Payer: Medicare Other | Admitting: Internal Medicine

## 2019-02-07 ENCOUNTER — Encounter

## 2019-04-02 ENCOUNTER — Encounter (HOSPITAL_BASED_OUTPATIENT_CLINIC_OR_DEPARTMENT_OTHER): Payer: Medicare Other | Admitting: Internal Medicine
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
## 2019-05-03 ENCOUNTER — Ambulatory Visit: Payer: Medicare Other | Admitting: Gastroenterology

## 2019-05-29 ENCOUNTER — Ambulatory Visit: Payer: Medicare Other | Admitting: Gastroenterology

## 2019-07-12 ENCOUNTER — Ambulatory Visit: Payer: Medicare Other | Admitting: Physician Assistant

## 2019-08-07 ENCOUNTER — Encounter: Payer: Self-pay | Admitting: Gastroenterology

## 2019-08-07 ENCOUNTER — Other Ambulatory Visit (INDEPENDENT_AMBULATORY_CARE_PROVIDER_SITE_OTHER): Payer: Medicare Other

## 2019-08-07 ENCOUNTER — Ambulatory Visit (INDEPENDENT_AMBULATORY_CARE_PROVIDER_SITE_OTHER): Payer: Medicare Other | Admitting: Gastroenterology

## 2019-08-07 VITALS — BP 136/76 | HR 94 | Ht 69.0 in | Wt 255.8 lb

## 2019-08-07 DIAGNOSIS — K625 Hemorrhage of anus and rectum: Secondary | ICD-10-CM | POA: Diagnosis not present

## 2019-08-07 DIAGNOSIS — K6289 Other specified diseases of anus and rectum: Secondary | ICD-10-CM

## 2019-08-07 DIAGNOSIS — R195 Other fecal abnormalities: Secondary | ICD-10-CM | POA: Diagnosis not present

## 2019-08-07 LAB — CBC WITH DIFFERENTIAL/PLATELET
Basophils Absolute: 0 10*3/uL (ref 0.0–0.1)
Basophils Relative: 0.9 % (ref 0.0–3.0)
Eosinophils Absolute: 0.1 10*3/uL (ref 0.0–0.7)
Eosinophils Relative: 1.7 % (ref 0.0–5.0)
HCT: 41.7 % (ref 36.0–46.0)
Hemoglobin: 14 g/dL (ref 12.0–15.0)
Lymphocytes Relative: 38.9 % (ref 12.0–46.0)
Lymphs Abs: 1.9 10*3/uL (ref 0.7–4.0)
MCHC: 33.6 g/dL (ref 30.0–36.0)
MCV: 84.1 fl (ref 78.0–100.0)
Monocytes Absolute: 0.5 10*3/uL (ref 0.1–1.0)
Monocytes Relative: 10 % (ref 3.0–12.0)
Neutro Abs: 2.4 10*3/uL (ref 1.4–7.7)
Neutrophils Relative %: 48.5 % (ref 43.0–77.0)
Platelets: 369 10*3/uL (ref 150.0–400.0)
RBC: 4.96 Mil/uL (ref 3.87–5.11)
RDW: 14.5 % (ref 11.5–15.5)
WBC: 5 10*3/uL (ref 4.0–10.5)

## 2019-08-07 MED ORDER — NA SULFATE-K SULFATE-MG SULF 17.5-3.13-1.6 GM/177ML PO SOLN: ORAL | 0 refills | Status: DC

## 2019-08-07 MED ORDER — DILTIAZEM GEL 2 %: CUTANEOUS | 1 refills | Status: AC

## 2019-08-07 NOTE — Patient Instructions (Addendum)
Your provider has requested that you go to the basement level for lab work before leaving today. Press "B" on the elevator. The lab is located at the first door on the left as you exit the elevator.  You have also been given IB Donald Prose and FD Donald Prose to take as directed on package.  For fissure: 1. Eat 25-30 g of fiber in your diet through fruits, vegetables, and bran fibers7 2. Drink at least 64 ounces of water 3. Sitz bath after a bowel movement will help with the pain (epsome salt in a bathtub with warm water, sit in the warm water 10 minutes up to twice a day as needed) 4. Citrucel or Metamucil 1 tblsp twice daily, titrate to a soft, formed stool 5. Diltiazem 2% with lidocaine 5% ointment applied to the rectum 3-4 times daily 6. Limit your time on the toilet. Sit no longer than 5 minutes. Resist straining. 7.  Before wiping after a bowel movement, swipe the paper into a jar of vaseline to give the paper a slight covering of the vaseline until fully healed. This will help reduce irriation and discomfort to the area.  We have sent a prescription for diltiazem gel to [facility]. You should apply a pea size amount to your rectum 3-4 x daily.   [facility]'s information is below: Address: [address], [place], [place] [address]  [phone]  *Please DO NOT go directly from our office to pick up this medication! Give the pharmacy 1 day to process the prescription as this is compounded and takes time to make.  Colonoscopy recommended to be sure that something else isn't going on.
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
## 2019-08-15 ENCOUNTER — Encounter: Payer: Self-pay | Admitting: Gastroenterology

## 2019-08-15 ENCOUNTER — Telehealth: Payer: Self-pay | Admitting: Gastroenterology

## 2019-08-15 NOTE — Telephone Encounter (Signed)
Pt stated that [facility] [address] has prep in stock but it costs over $100.  Pt requested a cheaper alternative.  Pt is scheduled for a colonoscopy 08/17/19.

## 2019-08-16 NOTE — Telephone Encounter (Signed)
Pt needs a replacement prep.  I offered her a Plenvu sample and she is willing to come and pick up a sample and new instructions.

## 2019-08-17 ENCOUNTER — Encounter: Payer: Self-pay | Admitting: Gastroenterology

## 2019-08-17 ENCOUNTER — Ambulatory Visit (AMBULATORY_SURGERY_CENTER): Payer: Medicare Other | Admitting: Gastroenterology

## 2019-08-17 ENCOUNTER — Other Ambulatory Visit: Payer: Self-pay

## 2019-08-17 ENCOUNTER — Other Ambulatory Visit: Payer: Self-pay | Admitting: Gastroenterology

## 2019-08-17 VITALS — BP 124/80 | HR 83 | Temp 96.6°F | Resp 11 | Ht 69.0 in | Wt 255.0 lb

## 2019-08-17 DIAGNOSIS — K6289 Other specified diseases of anus and rectum: Secondary | ICD-10-CM

## 2019-08-17 DIAGNOSIS — K635 Polyp of colon: Secondary | ICD-10-CM

## 2019-08-17 DIAGNOSIS — K5730 Diverticulosis of large intestine without perforation or abscess without bleeding: Secondary | ICD-10-CM | POA: Diagnosis not present

## 2019-08-17 DIAGNOSIS — D125 Benign neoplasm of sigmoid colon: Secondary | ICD-10-CM

## 2019-08-17 DIAGNOSIS — Z1211 Encounter for screening for malignant neoplasm of colon: Secondary | ICD-10-CM | POA: Diagnosis not present

## 2019-08-17 MED ORDER — SODIUM CHLORIDE 0.9 % IV SOLN: 500.0000 mL | INTRAVENOUS | Status: DC

## 2019-08-17 NOTE — Op Note (Signed)
[facility] Patient Name: Kelsey Ortega Procedure Date: 08/17/2019 2:57 PM MRN: [id number] Endoscopist: Thornton Park MD, MD Age: 59 Referring MD:  Date of Birth: January 02, 1961 Gender: Female Account #: [id number] Procedure:                Colonoscopy Indications:              Rectal pain and intermittent bleeding                           Change in bowel habits - stool now thin                           Gas and bloating despite Florastor                           - some response to IBGuard                           Recurrent diverticulitis in the setting of                            pancolonic diverticulosis                           - CT confirmed 2010 and 2019                           Chronic constipation improved on Linzess                           Small internal hemorrhoids                           Screening colonoscopy with Dr. Collene Mares 2018 Medicines:                Monitored Anesthesia Care Procedure:                Pre-Anesthesia Assessment:                           - Prior to the procedure, a History and Physical                            was performed, and patient medications and                            allergies were reviewed. The patient's tolerance of                            previous anesthesia was also reviewed. The risks                            and benefits of the procedure and the sedation                            options and risks were discussed with the patient.  All questions were answered, and informed consent                            was obtained. Prior Anticoagulants: The patient has                            taken no previous anticoagulant or antiplatelet                            agents. ASA Grade Assessment: III - A patient with                            severe systemic disease. After reviewing the risks                            and benefits, the patient was deemed in                             satisfactory condition to undergo the procedure.                           After obtaining informed consent, the colonoscope                            was passed under direct vision. Throughout the                            procedure, the patient's blood pressure, pulse, and                            oxygen saturations were monitored continuously. The                            Colonoscope was introduced through the anus and                            advanced to the 3 cm into the ileum. The                            colonoscopy was performed without difficulty. The                            patient tolerated the procedure well. The quality                            of the bowel preparation was good. The terminal                            ileum, ileocecal valve, appendiceal orifice, and                            rectum were photographed. Scope In: 3:26:57 PM Scope Out: 3:39:30 PM Scope Withdrawal Time: 0 hours 8 minutes 34 seconds  Total Procedure  Duration: 0 hours 12 minutes 33 seconds  Findings:                 The perianal and digital rectal examinations were                            normal.                           Multiple small and large-mouthed diverticula were                            found in the sigmoid colon and descending colon.                           A less than 1 mm polyp was found in the descending                            colon. The polyp was flat. The polyp was removed                            with a cold biopsy forceps. Resection and retrieval                            were complete. Estimated blood loss: none.                           The colon (entire examined portion) appeared                            normal. Biopsies for histology were taken with a                            cold forceps from the entire colon for evaluation                            of microscopic colitis.                           Non-bleeding internal hemorrhoids were  found. The                            hemorrhoids were medium-sized. Complications:            No immediate complications. Estimated blood loss:                            Minimal. Estimated Blood Loss:     Estimated blood loss was minimal. Impression:               - Diverticulosis in the sigmoid colon and in the                            descending colon.                           -  One less than 1 mm polyp in the descending colon,                            removed with a cold biopsy forceps. Resected and                            retrieved.                           - The entire examined colon is normal. Biopsied.                           - Non-bleeding internal hemorrhoids. The likely                            source of rectal bleeding. Recommendation:           - Patient has a contact number available for                            emergencies. The signs and symptoms of potential                            delayed complications were discussed with the                            patient. Return to normal activities tomorrow.                            Written discharge instructions were provided to the                            patient.                           - Resume previous diet.                           - Continue present medications.                           - Await pathology results.                           - Repeat colonoscopy date to be determined after                            pending pathology results are reviewed for                            surveillance.                           - Emerging evidence supports eating a diet of                            fruits, vegetables, grains,  calcium, and yogurt                            while reducing red meat and alcohol may reduce the                            risk of colon cancer.                           - Thank you for allowing me to be involved in your                            colon cancer prevention.                            - Start using Anusol HC 2.5% applied sparingly to                            your rectum twice daily.                           - If you would like more information,                            MyGIHealth.com and UpToDate.com have good                            information about hemorrhoids.                           - Emerging evidence supports eating a diet of                            fruits, vegetables, grains, calcium, and yogurt                            while reducing red meat and alcohol may reduce the                            risk of colon cancer.                           - Thank you for allowing me to be involved in your                            colon cancer prevention. Thornton Park MD, MD 08/17/2019 3:48:01 PM This report has been signed electronically.

## 2019-08-17 NOTE — Progress Notes (Signed)
VS- Kelsey Ortega 

## 2019-08-17 NOTE — Patient Instructions (Signed)
Read all of the handouts given to you by your recovery room nurse.  Thank-you for choosing [place] for your healthcare needs today.  YOU HAD AN ENDOSCOPIC PROCEDURE TODAY AT [facility]:   Refer to the procedure report that was given to you for any specific questions about what was found during the examination.  If the procedure report does not answer your questions, please call your gastroenterologist to clarify.  If you requested that your care partner not be given the details of your procedure findings, then the procedure report has been included in a sealed envelope for you to review at your convenience later.  YOU SHOULD EXPECT: Some feelings of bloating in the abdomen. Passage of more gas than usual.  Walking can help get rid of the air that was put into your GI tract during the procedure and reduce the bloating. If you had a lower endoscopy (such as a colonoscopy or flexible sigmoidoscopy) you may notice spotting of blood in your stool or on the toilet paper. If you underwent a bowel prep for your procedure, you may not have a normal bowel movement for a few days.  Please Note:  You might notice some irritation and congestion in your nose or some drainage.  This is from the oxygen used during your procedure.  There is no need for concern and it should clear up in a day or so.  SYMPTOMS TO REPORT IMMEDIATELY:   Following lower endoscopy (colonoscopy or flexible sigmoidoscopy):  Excessive amounts of blood in the stool  Significant tenderness or worsening of abdominal pains  Swelling of the abdomen that is new, acute  Fever of 100F or higher   For urgent or emergent issues, a gastroenterologist can be reached at any hour by calling [phone]. Do not use MyChart messaging for urgent concerns.    DIET:  We do recommend a small meal at first, but then you may proceed to your regular diet.  Drink plenty of fluids but you should avoid alcoholic beverages for 24 hours. Try to  increase the fiber in your diet, and drink plenty of water.  Cut back on red meat and alcohol.  ACTIVITY:  You should plan to take it easy for the rest of today and you should NOT DRIVE or use heavy machinery until tomorrow (because of the sedation medicines used during the test).    FOLLOW UP: Our staff will call the number listed on your records 48-72 hours following your procedure to check on you and address any questions or concerns that you may have regarding the information given to you following your procedure. If we do not reach you, we will leave a message.  We will attempt to reach you two times.  During this call, we will ask if you have developed any symptoms of COVID 19. If you develop any symptoms (ie: fever, flu-like symptoms, shortness of breath, cough etc.) before then, please call [phone].  If you test positive for Covid 19 in the 2 weeks post procedure, please call and report this information to [place].    If any biopsies were taken you will be contacted by phone or by letter within the next 1-3 weeks.  Please call us at [phone] if you have not heard about the biopsies in 3 weeks.    SIGNATURES/CONFIDENTIALITY: You and/or your care partner have signed paperwork which will be entered into your electronic medical record.  These signatures attest to the fact that that the information above  on your After Visit Summary has been reviewed and is understood.  Full responsibility of the confidentiality of this discharge information lies with you and/or your care-partner.

## 2019-08-17 NOTE — Progress Notes (Signed)
To PACU, VSS. Report to [email or web address] 

## 2019-08-17 NOTE — Progress Notes (Signed)
Called to room to assist during endoscopic procedure.  Patient ID and intended procedure confirmed with present staff. Received instructions for my participation in the procedure from the performing physician.  

## 2019-08-21 ENCOUNTER — Telehealth: Payer: Self-pay | Admitting: *Deleted

## 2019-08-21 NOTE — Telephone Encounter (Signed)
  Follow up Call-  Call back number 08/17/2019  Post procedure Call Back phone  # [phone]  Permission to leave phone message Yes  Some recent data might be hidden     Patient questions:  Do you have a fever, pain , or abdominal swelling? Yes.   Pain Score  1 * states she has had some abdominal tenderness but that it has "pretty much dissipated at this time." Told to call if pain worsens. Patient agreeable.   Have you tolerated food without any problems? Yes.    Have you been able to return to your normal activities? Yes.    Do you have any questions about your discharge instructions: Diet   No. Medications  No. Follow up visit  No.  Do you have questions or concerns about your Care? No.  Actions: * If pain score is 4 or above: No action needed, pain <4.  1. Have you developed a fever since your procedure? no  2.   Have you had an respiratory symptoms (SOB or cough) since your procedure? no  3.   Have you tested positive for COVID 19 since your procedure no  4.   Have you had any family members/close contacts diagnosed with the COVID 19 since your procedure?  no   If yes to any of these questions please route to Joylene John, RN and Erenest Rasher, RN

## 2019-08-24 ENCOUNTER — Encounter: Payer: Self-pay | Admitting: Gastroenterology

## 2019-09-03 ENCOUNTER — Other Ambulatory Visit: Payer: Self-pay | Admitting: Obstetrics & Gynecology

## 2019-09-03 DIAGNOSIS — Z1231 Encounter for screening mammogram for malignant neoplasm of breast: Secondary | ICD-10-CM

## 2019-09-06 ENCOUNTER — Other Ambulatory Visit: Payer: Self-pay | Admitting: Internal Medicine

## 2019-09-07 ENCOUNTER — Other Ambulatory Visit: Payer: Self-pay | Admitting: Internal Medicine

## 2019-09-11 ENCOUNTER — Other Ambulatory Visit: Payer: Self-pay | Admitting: Internal Medicine

## 2019-09-11 DIAGNOSIS — N644 Mastodynia: Secondary | ICD-10-CM

## 2019-09-17 ENCOUNTER — Telehealth: Payer: Self-pay | Admitting: Gastroenterology

## 2019-09-17 NOTE — Telephone Encounter (Signed)
I recommend that she resume FDGard/IBGard if she has not already done so. If she has and is still not feeling better, adding a daily probiotic might help get her colonic bacteria back in a healthy balance. If she is also doing that, we could try Xifaxan 550 mg TID x 14 days. Please schedule an office visit with me. I hope she is feeling better soon. Thank you.

## 2019-09-17 NOTE — Telephone Encounter (Signed)
Pt is requesting a call back from a nurse. Pt states she had a colon done on 6/11. Pt states ever since she has been experiencing bloating that won't come down.

## 2019-09-17 NOTE — Telephone Encounter (Signed)
Left message for pt to call back.  Pt states that since she had colon done her abdomen has been very bloated. States she is not constipated and she has not had wt gain. States she is eating a low carb diet but her pants are fitting tighter and some are uncomfortable. Please advise.

## 2019-09-18 NOTE — Telephone Encounter (Signed)
Spoke with pt and she is aware and will resume FD/IB guard and start taking a probiotic. Pt knows to call back if no improvement and she will schedule OV at that time if not better.

## 2019-09-26 ENCOUNTER — Other Ambulatory Visit: Payer: Medicare Other

## 2019-09-27 ENCOUNTER — Other Ambulatory Visit: Payer: Self-pay

## 2019-09-27 ENCOUNTER — Ambulatory Visit: Payer: Medicare Other

## 2019-09-27 ENCOUNTER — Ambulatory Visit
Admission: RE | Admit: 2019-09-27 | Discharge: 2019-09-27 | Disposition: A | Discharge status: Home / Self Care | Payer: Medicare Other | Source: Ambulatory Visit | Attending: Internal Medicine | Admitting: Internal Medicine

## 2019-09-27 ENCOUNTER — Other Ambulatory Visit: Payer: Medicare Other

## 2019-09-27 DIAGNOSIS — N644 Mastodynia: Secondary | ICD-10-CM

## 2019-09-27 DIAGNOSIS — R928 Other abnormal and inconclusive findings on diagnostic imaging of breast: Secondary | ICD-10-CM | POA: Diagnosis not present

## 2019-11-14 DIAGNOSIS — R221 Localized swelling, mass and lump, neck: Secondary | ICD-10-CM | POA: Diagnosis not present

## 2019-11-14 DIAGNOSIS — H5789 Other specified disorders of eye and adnexa: Secondary | ICD-10-CM | POA: Diagnosis not present

## 2019-11-14 DIAGNOSIS — E119 Type 2 diabetes mellitus without complications: Secondary | ICD-10-CM | POA: Diagnosis not present

## 2019-11-14 DIAGNOSIS — I10 Essential (primary) hypertension: Secondary | ICD-10-CM | POA: Diagnosis not present

## 2019-11-14 DIAGNOSIS — E118 Type 2 diabetes mellitus with unspecified complications: Secondary | ICD-10-CM | POA: Diagnosis not present

## 2019-11-20 DIAGNOSIS — L700 Acne vulgaris: Secondary | ICD-10-CM | POA: Diagnosis not present

## 2019-11-20 DIAGNOSIS — Z0000 Encounter for general adult medical examination without abnormal findings: Secondary | ICD-10-CM | POA: Diagnosis not present

## 2019-11-20 DIAGNOSIS — E119 Type 2 diabetes mellitus without complications: Secondary | ICD-10-CM | POA: Diagnosis not present

## 2019-11-20 DIAGNOSIS — I10 Essential (primary) hypertension: Secondary | ICD-10-CM | POA: Diagnosis not present

## 2019-11-20 DIAGNOSIS — E669 Obesity, unspecified: Secondary | ICD-10-CM | POA: Diagnosis not present

## 2020-02-03 ENCOUNTER — Telehealth: Payer: Self-pay | Admitting: Internal Medicine

## 2020-02-03 MED ORDER — METRONIDAZOLE 500 MG PO TABS: 500.0000 mg | ORAL_TABLET | Freq: Two times a day (BID) | ORAL | 0 refills | Status: DC

## 2020-02-03 MED ORDER — CIPROFLOXACIN HCL 500 MG PO TABS: 500.0000 mg | ORAL_TABLET | Freq: Two times a day (BID) | ORAL | 0 refills | Status: DC

## 2020-02-03 NOTE — Telephone Encounter (Signed)
Thanks for your help.

## 2020-02-03 NOTE — Telephone Encounter (Signed)
Over the past 24 hours the patient has developed left lower quadrant pain that is similar to previous episodes of CT proven sigmoid diverticulitis.  She is moving her bowels, not constipated, temperature 99, pain increasing over the last 24 hours.  Tolerating a diet.   Meds ordered this encounter  Medications  . ciprofloxacin (CIPRO) 500 MG tablet    Sig: Take 1 tablet (500 mg total) by mouth 2 (two) times daily.    Dispense:  20 tablet    Refill:  0  . metroNIDAZOLE (FLAGYL) 500 MG tablet    Sig: Take 1 tablet (500 mg total) by mouth 2 (two) times daily.    Dispense:  20 tablet    Refill:  0   Treat with antibiotics as above, we will call her back and see about arranging follow-up this week for reassessment.  Advised liquid to very soft diet for right now.  To present to the emergency department if pain worsening, intolerable, fevers, intractable nausea vomiting.

## 2020-02-04 NOTE — Telephone Encounter (Signed)
Spoke with patient to schedule a follow up with Dr. Tarri Glenn for reassessment. Patient has been scheduled on 02/08/20 at 1:50 PM. Patient had no other concerns at the end of the call.

## 2020-02-08 ENCOUNTER — Ambulatory Visit: Payer: Medicare Other | Admitting: Gastroenterology

## 2020-02-18 ENCOUNTER — Telehealth: Payer: Self-pay | Admitting: Gastroenterology

## 2020-02-18 ENCOUNTER — Other Ambulatory Visit: Payer: Self-pay

## 2020-02-18 MED ORDER — FLUCONAZOLE 150 MG PO TABS: 150.0000 mg | ORAL_TABLET | Freq: Every day | ORAL | 0 refills | Status: DC

## 2020-02-18 NOTE — Telephone Encounter (Signed)
Please prescription Diflucan 150 mg x 1. Hope she is feeling better soon. Thank you.

## 2020-02-18 NOTE — Telephone Encounter (Signed)
Pt was given cipro and flagyl to take for diverticulitis on 02/03/20. Pt now states she has a yeast infection and is requesting diflucan be called in for her. Please advise.

## 2020-02-18 NOTE — Telephone Encounter (Signed)
Patient states that she finished round of that caused her to get "female issue" would like diflucan called in to [facility] on [address].

## 2020-02-18 NOTE — Telephone Encounter (Signed)
Pt aware and script sent to pharmacy. 

## 2020-02-26 ENCOUNTER — Other Ambulatory Visit: Payer: Self-pay | Admitting: Internal Medicine

## 2020-02-26 DIAGNOSIS — E65 Localized adiposity: Secondary | ICD-10-CM | POA: Diagnosis not present

## 2020-02-26 DIAGNOSIS — R3989 Other symptoms and signs involving the genitourinary system: Secondary | ICD-10-CM | POA: Diagnosis not present

## 2020-02-26 DIAGNOSIS — M79651 Pain in right thigh: Secondary | ICD-10-CM

## 2020-02-26 DIAGNOSIS — R5382 Chronic fatigue, unspecified: Secondary | ICD-10-CM | POA: Diagnosis not present

## 2020-02-26 DIAGNOSIS — L709 Acne, unspecified: Secondary | ICD-10-CM | POA: Diagnosis not present

## 2020-02-26 DIAGNOSIS — R200 Anesthesia of skin: Secondary | ICD-10-CM | POA: Diagnosis not present

## 2020-02-27 ENCOUNTER — Other Ambulatory Visit: Payer: Medicare Other

## 2020-02-28 ENCOUNTER — Ambulatory Visit: Payer: Medicare Other | Admitting: Gastroenterology

## 2020-03-05 DIAGNOSIS — R200 Anesthesia of skin: Secondary | ICD-10-CM | POA: Diagnosis not present

## 2020-03-05 DIAGNOSIS — M25511 Pain in right shoulder: Secondary | ICD-10-CM | POA: Diagnosis not present

## 2020-03-05 DIAGNOSIS — R1031 Right lower quadrant pain: Secondary | ICD-10-CM | POA: Diagnosis not present

## 2020-04-22 ENCOUNTER — Other Ambulatory Visit: Payer: Medicare Other

## 2020-04-25 DIAGNOSIS — M25551 Pain in right hip: Secondary | ICD-10-CM | POA: Diagnosis not present

## 2020-04-25 DIAGNOSIS — M25511 Pain in right shoulder: Secondary | ICD-10-CM | POA: Diagnosis not present

## 2020-04-25 DIAGNOSIS — M79669 Pain in unspecified lower leg: Secondary | ICD-10-CM | POA: Diagnosis not present

## 2020-04-29 ENCOUNTER — Other Ambulatory Visit: Payer: Self-pay

## 2020-04-29 ENCOUNTER — Other Ambulatory Visit (HOSPITAL_COMMUNITY): Payer: Self-pay | Admitting: Orthopedic Surgery

## 2020-04-29 ENCOUNTER — Ambulatory Visit (HOSPITAL_COMMUNITY)
Admission: RE | Admit: 2020-04-29 | Discharge: 2020-04-29 | Disposition: A | Discharge status: Home / Self Care | Payer: Medicare Other | Source: Ambulatory Visit | Attending: Orthopedic Surgery | Admitting: Orthopedic Surgery

## 2020-04-29 DIAGNOSIS — M79662 Pain in left lower leg: Secondary | ICD-10-CM

## 2020-04-29 DIAGNOSIS — M79661 Pain in right lower leg: Secondary | ICD-10-CM | POA: Diagnosis not present

## 2020-05-19 DIAGNOSIS — R300 Dysuria: Secondary | ICD-10-CM | POA: Diagnosis not present

## 2020-05-19 DIAGNOSIS — N952 Postmenopausal atrophic vaginitis: Secondary | ICD-10-CM | POA: Diagnosis not present

## 2020-06-04 DIAGNOSIS — J4520 Mild intermittent asthma, uncomplicated: Secondary | ICD-10-CM | POA: Diagnosis not present

## 2020-06-04 DIAGNOSIS — F341 Dysthymic disorder: Secondary | ICD-10-CM | POA: Diagnosis not present

## 2020-06-04 DIAGNOSIS — R739 Hyperglycemia, unspecified: Secondary | ICD-10-CM | POA: Diagnosis not present

## 2020-06-04 DIAGNOSIS — K5909 Other constipation: Secondary | ICD-10-CM | POA: Diagnosis not present

## 2020-06-13 DIAGNOSIS — N898 Other specified noninflammatory disorders of vagina: Secondary | ICD-10-CM | POA: Diagnosis not present

## 2020-06-13 DIAGNOSIS — N952 Postmenopausal atrophic vaginitis: Secondary | ICD-10-CM | POA: Diagnosis not present

## 2020-06-25 DIAGNOSIS — E669 Obesity, unspecified: Secondary | ICD-10-CM | POA: Diagnosis not present

## 2020-06-25 DIAGNOSIS — I10 Essential (primary) hypertension: Secondary | ICD-10-CM | POA: Diagnosis not present

## 2020-06-25 DIAGNOSIS — E119 Type 2 diabetes mellitus without complications: Secondary | ICD-10-CM | POA: Diagnosis not present

## 2020-06-26 DIAGNOSIS — R1032 Left lower quadrant pain: Secondary | ICD-10-CM | POA: Diagnosis not present

## 2020-06-26 DIAGNOSIS — N762 Acute vulvitis: Secondary | ICD-10-CM | POA: Diagnosis not present

## 2020-06-26 DIAGNOSIS — R102 Pelvic and perineal pain: Secondary | ICD-10-CM | POA: Diagnosis not present

## 2020-07-03 DIAGNOSIS — R102 Pelvic and perineal pain: Secondary | ICD-10-CM | POA: Diagnosis not present

## 2020-07-03 DIAGNOSIS — N905 Atrophy of vulva: Secondary | ICD-10-CM | POA: Diagnosis not present

## 2020-07-03 DIAGNOSIS — R82998 Other abnormal findings in urine: Secondary | ICD-10-CM | POA: Diagnosis not present

## 2020-07-04 ENCOUNTER — Telehealth: Payer: Self-pay | Admitting: Gastroenterology

## 2020-07-04 NOTE — Telephone Encounter (Signed)
Patient called states she is having a lot of abdominal pain along with constipation and is seeking advise.

## 2020-07-04 NOTE — Telephone Encounter (Signed)
Pt states she is having pain in her RLQ along with bloating. Reports last night after she ate her abd was so bolated she had to take her bra off due to the discomfort. States when she coughs the area in the RLQ is sore and tender. Pt has had diverticulitis in the past. Please advise.

## 2020-07-04 NOTE — Telephone Encounter (Signed)
If she is concerned about diverticulitis, I would recommend evaluation in the ED. Otherwise, please schedule office follow-up with me or an APP within the next few days. Thank you.

## 2020-07-07 NOTE — Telephone Encounter (Signed)
Pt scheduled to see Tye Savoy NP tomorrow am at 8:30, pt aware of appt.

## 2020-07-08 ENCOUNTER — Ambulatory Visit (INDEPENDENT_AMBULATORY_CARE_PROVIDER_SITE_OTHER): Payer: Medicare Other | Admitting: Nurse Practitioner

## 2020-07-08 ENCOUNTER — Other Ambulatory Visit (INDEPENDENT_AMBULATORY_CARE_PROVIDER_SITE_OTHER): Payer: Medicare Other

## 2020-07-08 ENCOUNTER — Encounter: Payer: Self-pay | Admitting: Nurse Practitioner

## 2020-07-08 VITALS — BP 110/60 | HR 80 | Ht 68.25 in | Wt 244.1 lb

## 2020-07-08 DIAGNOSIS — R1030 Lower abdominal pain, unspecified: Secondary | ICD-10-CM

## 2020-07-08 LAB — BASIC METABOLIC PANEL
BUN: 18 mg/dL (ref 6–23)
CO2: 29 mEq/L (ref 19–32)
Calcium: 10 mg/dL (ref 8.4–10.5)
Chloride: 100 mEq/L (ref 96–112)
Creatinine, Ser: 0.94 mg/dL (ref 0.40–1.20)
GFR: 67.22 mL/min (ref >60.00)
Glucose, Bld: 105 mg/dL — ABNORMAL HIGH (ref 70–99)
Potassium: 3.2 mEq/L — ABNORMAL LOW (ref 3.5–5.1)
Sodium: 139 mEq/L (ref 135–145)

## 2020-07-08 NOTE — Patient Instructions (Signed)
Your provider has requested that you go to the basement level for lab work before leaving today. Press "B" on the elevator. The lab is located at the first door on the left as you exit the elevator.  You have been scheduled for a CT scan of the abdomen and pelvis at [facility] ([address]---this is in the same building as [employer]).   You are scheduled on 07/10/20 at 11:00am. You should arrive 15 minutes prior to your appointment time for registration. Please follow the written instructions below on the day of your exam:  WARNING: IF YOU ARE ALLERGIC TO IODINE/X-RAY DYE, PLEASE NOTIFY RADIOLOGY IMMEDIATELY AT [phone]! YOU WILL BE GIVEN A 13 HOUR PREMEDICATION PREP.  1) Do not eat or drink anything after 7:00am (4 hours prior to your test) 2) You have been given 2 bottles of oral contrast to drink. The solution may taste better if refrigerated, but do NOT add ice or any other liquid to this solution. Shake well before drinking.    Drink 1 bottle of contrast @ 9:00am (2 hours prior to your exam)  Drink 1 bottle of contrast @ 10:00am (1 hour prior to your exam)  You may take any medications as prescribed with a small amount of water, if necessary. If you take any of the following medications: METFORMIN, GLUCOPHAGE, GLUCOVANCE, AVANDAMET, RIOMET, FORTAMET, [place] MET, JANUMET, GLUMETZA or METAGLIP, you MAY be asked to HOLD this medication 48 hours AFTER the exam.  The purpose of you drinking the oral contrast is to aid in the visualization of your intestinal tract. The contrast solution may cause some diarrhea. Depending on your individual set of symptoms, you may also receive an intravenous injection of x-ray contrast/dye. Plan on being at [facility] for 30 minutes or longer, depending on the type of exam you are having performed.  This test typically takes 30-45 minutes to complete.  If you have any questions regarding your exam or if you need to  reschedule, you may call the CT department at [phone] between the hours of 8:00 am and 5:00 pm, Monday-Friday.  ________________________________________________________________________  Due to recent changes in healthcare laws, you may see the results of your imaging and laboratory studies on MyChart before your provider has had a chance to review them.  We understand that in some cases there may be results that are confusing or concerning to you. Not all laboratory results come back in the same time frame and the provider may be waiting for multiple results in order to interpret others.  Please give [place] 48 hours in order for your provider to thoroughly review all the results before contacting the office for clarification of your results.   Thank you for choosing me and [facility].  Tye Savoy NP

## 2020-07-08 NOTE — Progress Notes (Signed)
ASSESSMENT AND PLAN    #60 year old female with generalized abdominal pain with some radiation into her left groin.  She is understandably concerned that at least some of her symptoms may represent recurrent diverticulitis.  However, the overall picture is a little confusing because she has also has episodes of hematuria and complains of a shooting type urethral pain. She has recently been evaluated by two urologists ( [facility] and a [facility] urologist) and no further evaluation is planned.    -- In an effort to sort out some of her symptoms and evaluate for recurrent diverticulitis patient will be scheduled for CT scan of the abdomen and pelvis.  We will notify her of the results.   HISTORY OF PRESENT ILLNESS    Chief Complaint : abdominal pain   Kelsey Ortega is a 60 y.o. female known to Kelsey Ortega with a past medical history significant for diverticulitis, hepatic steatosis, DM2, cholecystectomy.  See PMH below for any additional medical problems.    June 2018 - office visit for change in bowel habits, chronic constipation, rectal pain, gas and bloating and history of diverticulitis.  TTG, IgA normal.  She subsequently underwent colonoscopy with findings of diverticulosis, hemorrhoids and 4 small polyps.   November 2021 - Patient called the office with LLQ pain similar to diverticulitis. We treated her with Cipro and Flagyl. Looks like she didn't make it in for follow up early December but says her symptoms resolved.  She subsequently underwent colonoscopy with findings of diverticulosis in the sigmoid colon and in the descending colon, one less than 1 mm polyp in the descending colon, non-bleeding internal hemorrhoids.   Patient called the office 07/04/2020 with concerns about recurrent diverticulitis and was given this appointment today.  She has been having generalized lower abdominal pain sometimes radiating into left groin. She also has frequent "shooting pain" into her  urethra but not only when she urinates.  She denies fevers.  Patient is concerned about recurrent diverticulitis.    Kelsey Ortega recently saw blood in her urine. PCP referred her to [facility] but patient says it wasn't a good outcome. She was then referred to Urology at [facility] but says that no testing was done, she was dissatisfied with the results of the visit.  I reviewed the Urology office note in care everywhere from 07/03/2020.  The overall assessment was that she was having genitourinary syndrome of menopause and severe urogenital atrophy.  Topical estrogen in the vagina was recommended.  Pelvic floor and groin pain felt most likely to be musculoskeletal.  Pelvic floor PT recommended.   In addition to above patient complains of bowel changes over the last month. She has tiny, sometimes loose stools. She normally takes Fiber but stopped it due to recent bloating.  Due to concerns about being constipated patient took [place] which did not help with the bowel changes made the abdominal pain  Tylenol provides only temporary relief.     PREVIOUS ENDOSCOPIC EVALUATIONS / PERTINENT STUDIES:   June 2021 colonoscopy for bowel changes, rectal pain and history of diverticulitis -- Complete exam, good prep -- Sigmoid and descending colon diverticulosis, < 1 mm descending colon polyp.  Nonbleeding internal hemorrhoids  Polyp was hyperplastic.  Random colon biopsies were negative for microscopic colitis   Past Medical History:  Diagnosis Date  . Allergy   . Arthritis   . Diverticulosis   . GERD (gastroesophageal reflux disease)   . Headaches, cluster   . History  of shingles   . Hypertension   . Menorrhagia   . PCOS (polycystic ovarian syndrome)   . Right knee pain   . Sleep apnea    no cpap    Current Medications, Allergies, Past Surgical History, Family History and Social History were reviewed in [employer] record.   Current Outpatient Medications   Medication Sig Dispense Refill  . aspirin EC 81 MG tablet Take 81 mg daily by mouth.  (Patient not taking: Reported on 08/17/2019)    . B Complex-C-E-Zn (B COMPLEX-C-E-ZINC) tablet Take 1 tablet daily as needed by mouth.    . Biotin 1000 MCG tablet Take 1,000 mcg by mouth daily.    . Calcium Carbonate-Vitamin D (CALTRATE 600+D PO) Take 1 tablet daily by mouth.    . ciprofloxacin (CIPRO) 500 MG tablet Take 1 tablet (500 mg total) by mouth 2 (two) times daily. 20 tablet 0  . diazepam (VALIUM) 10 MG tablet Take 10 mg every 12 (twelve) hours as needed by mouth for anxiety.    Marland Kitchen diltiazem 2 % GEL Diltiazem 2% gel compounded with lidocaine 5 % to apply pea size amount to rectum four times a day 30 g 1  . fluconazole (DIFLUCAN) 150 MG tablet Take 1 tablet (150 mg total) by mouth daily. 1 tablet 0  . linaclotide (LINZESS) 145 MCG CAPS capsule Take 145 mcg by mouth daily before breakfast.    . metFORMIN (GLUCOPHAGE) 1000 MG tablet Take 2,000 mg 2 (two) times daily with a meal by mouth.     . metoprolol succinate (TOPROL-XL) 50 MG 24 hr tablet TAKE 1 TABLET BY MOUTH DAILY. TAKE WITH OR IMMEDIATELY FOLLOWING A MEAL. 30 tablet 3  . metroNIDAZOLE (FLAGYL) 500 MG tablet Take 1 tablet (500 mg total) by mouth 2 (two) times daily. 20 tablet 0  . Multiple Vitamins-Minerals (EYE VITAMINS & MINERALS) TABS Take 1 tablet 2 (two) times a week by mouth.    . Probiotic Product (PROBIOTIC DAILY PO) Take 1 capsule daily by mouth. (Patient not taking: Reported on 08/17/2019)    . triamterene-hydrochlorothiazide (MAXZIDE-25) 37.5-25 MG tablet TAKE 1 TABLET BY MOUTH ONCE DAILY IN MORNING 30 tablet 0   No current facility-administered medications for this visit.    Review of Systems: No chest pain. No shortness of breath. No urinary complaints.   PHYSICAL EXAM :    Wt Readings from Last 3 Encounters:  08/17/19 255 lb (115.7 kg)  08/07/19 255 lb 12.8 oz (116 kg)  09/01/18 250 lb (113.4 kg)    BP 110/60 (BP Location:  Left Arm, Patient Position: Sitting, Cuff Size: Normal)   Pulse 80   Ht 5' 8.25" (1.734 m) Comment: height measured without shoes  Wt 244 lb 2 oz (110.7 kg)   BMI 36.85 kg/m  Constitutional:  Pleasant female in no acute distress. Psychiatric: Normal mood and affect. Behavior is normal. EENT: Pupils normal.  Conjunctivae are normal. No scleral icterus. Neck supple.  Cardiovascular: Normal rate, regular rhythm. No edema Pulmonary/chest: Effort normal and breath sounds normal. No wheezing, rales or rhonchi. Abdominal: Soft, nondistended, generalized abdominal tenderness even to light palpation  Bowel sounds active throughout. There are no masses palpable. Neurological: Alert and oriented to person place and time. Skin: Skin is warm and dry. No rashes noted.  Tye Savoy, NP  07/08/2020, 8:28 AM

## 2020-07-10 ENCOUNTER — Ambulatory Visit (INDEPENDENT_AMBULATORY_CARE_PROVIDER_SITE_OTHER)
Admission: RE | Admit: 2020-07-10 | Discharge: 2020-07-10 | Disposition: A | Discharge status: Home / Self Care | Payer: Medicare Other | Source: Ambulatory Visit | Attending: Nurse Practitioner | Admitting: Nurse Practitioner

## 2020-07-10 ENCOUNTER — Other Ambulatory Visit: Payer: Self-pay

## 2020-07-10 DIAGNOSIS — R1030 Lower abdominal pain, unspecified: Secondary | ICD-10-CM

## 2020-07-10 DIAGNOSIS — R109 Unspecified abdominal pain: Secondary | ICD-10-CM | POA: Diagnosis not present

## 2020-07-10 MED ORDER — IOHEXOL 300 MG/ML  SOLN
100.0000 mL | Freq: Once | INTRAMUSCULAR | Status: AC | PRN
  Administered 2020-07-10: 100 mL via INTRAVENOUS

## 2020-07-14 ENCOUNTER — Other Ambulatory Visit: Payer: Self-pay

## 2020-07-14 ENCOUNTER — Telehealth: Payer: Self-pay | Admitting: Nurse Practitioner

## 2020-07-14 MED ORDER — LUBIPROSTONE 8 MCG PO CAPS: 8.0000 ug | ORAL_CAPSULE | Freq: Two times a day (BID) | ORAL | 3 refills | Status: AC

## 2020-07-14 NOTE — Telephone Encounter (Signed)
Inbound call from patient requesting a call with her CT scan results please.

## 2020-07-14 NOTE — Telephone Encounter (Signed)
See result note.  

## 2020-07-15 DIAGNOSIS — K5909 Other constipation: Secondary | ICD-10-CM | POA: Diagnosis not present

## 2020-07-15 DIAGNOSIS — I10 Essential (primary) hypertension: Secondary | ICD-10-CM | POA: Diagnosis not present

## 2020-07-15 DIAGNOSIS — J4520 Mild intermittent asthma, uncomplicated: Secondary | ICD-10-CM | POA: Diagnosis not present

## 2020-07-15 NOTE — Progress Notes (Signed)
Reviewed and agree with management plans. ? ?Kelsey Ortega L. Gokul Waybright, MD, MPH  ?

## 2020-08-20 ENCOUNTER — Telehealth: Payer: Self-pay | Admitting: Nurse Practitioner

## 2020-08-20 NOTE — Telephone Encounter (Signed)
Inbound call from patient requesting to get Dr. Tarri Glenn advise about Plenity medication she is taking for weight loss.  Please advise.

## 2020-08-21 NOTE — Telephone Encounter (Signed)
Spoke with pt and she is aware of Dr. Tarri Glenn comments.

## 2020-08-21 NOTE — Telephone Encounter (Signed)
Pt states PCP wanted to put her on ozempic for wt loss but she did not want to do that due to possible SE of cancer. He prescribed Plenity and she states it does not raise the heart rate. It is absorbed in the stomach and makes you feel full. She wanted to see if Dr. Tarri Glenn feels it is ok for her to take with her history of diverticulitis and hemorrhoids. Please advise.

## 2020-08-28 ENCOUNTER — Other Ambulatory Visit (HOSPITAL_COMMUNITY): Payer: Self-pay | Admitting: Obstetrics & Gynecology

## 2020-08-28 DIAGNOSIS — Z1231 Encounter for screening mammogram for malignant neoplasm of breast: Secondary | ICD-10-CM

## 2020-10-13 DIAGNOSIS — E669 Obesity, unspecified: Secondary | ICD-10-CM | POA: Diagnosis not present

## 2020-10-13 DIAGNOSIS — B353 Tinea pedis: Secondary | ICD-10-CM | POA: Diagnosis not present

## 2020-10-13 DIAGNOSIS — Z6838 Body mass index (BMI) 38.0-38.9, adult: Secondary | ICD-10-CM | POA: Diagnosis not present

## 2020-10-13 DIAGNOSIS — L98491 Non-pressure chronic ulcer of skin of other sites limited to breakdown of skin: Secondary | ICD-10-CM | POA: Diagnosis not present

## 2020-12-16 ENCOUNTER — Other Ambulatory Visit: Payer: Self-pay | Admitting: Internal Medicine

## 2020-12-16 DIAGNOSIS — Z1231 Encounter for screening mammogram for malignant neoplasm of breast: Secondary | ICD-10-CM

## 2021-01-16 ENCOUNTER — Telehealth: Payer: Self-pay | Admitting: Internal Medicine

## 2021-01-16 MED ORDER — CIPROFLOXACIN HCL 500 MG PO TABS: 500.0000 mg | ORAL_TABLET | Freq: Two times a day (BID) | ORAL | 0 refills | Status: DC

## 2021-01-16 MED ORDER — METRONIDAZOLE 500 MG PO TABS: 500.0000 mg | ORAL_TABLET | Freq: Two times a day (BID) | ORAL | 0 refills | Status: DC

## 2021-01-16 NOTE — Telephone Encounter (Signed)
Patient with a history of diverticulitis documented in the past.  Chart reviewed and shows that she has had CT documentation of diverticulitis in 2019.  She has been seen by [place] for left lower quadrant pain and a CT scan last year did not show diverticulitis.  She reports progressively increasing constipation without a bowel movement for 3 or 4 days, she has tried her Linzess she has tried multiple doses of MiraLAX.  She tried an enema.  She is convinced the pain she has is like her diverticulitis.  There is no fever there is no nausea or vomiting.  Meds ordered this encounter  Medications   ciprofloxacin (CIPRO) 500 MG tablet    Sig: Take 1 tablet (500 mg total) by mouth 2 (two) times daily.    Dispense:  20 tablet    Refill:  0   metroNIDAZOLE (FLAGYL) 500 MG tablet    Sig: Take 1 tablet (500 mg total) by mouth 2 (two) times daily.    Dispense:  20 tablet    Refill:  0   She is advised to stay on a liquid diet and to continue several doses of MiraLAX a day and to go to the emergency department if there are severe problems like vomiting or intractable pain.  Otherwise keep appointment with Dr. Tarri Glenn for early December.  If she is failing to progress she may call us back as well.

## 2021-02-11 ENCOUNTER — Ambulatory Visit: Payer: Medicare Other | Admitting: Gastroenterology

## 2021-03-05 DIAGNOSIS — I10 Essential (primary) hypertension: Secondary | ICD-10-CM | POA: Diagnosis not present

## 2021-03-05 DIAGNOSIS — E119 Type 2 diabetes mellitus without complications: Secondary | ICD-10-CM | POA: Diagnosis not present

## 2021-03-05 DIAGNOSIS — Z0000 Encounter for general adult medical examination without abnormal findings: Secondary | ICD-10-CM | POA: Diagnosis not present

## 2021-03-06 ENCOUNTER — Other Ambulatory Visit: Payer: Self-pay | Admitting: Internal Medicine

## 2021-03-06 DIAGNOSIS — I10 Essential (primary) hypertension: Secondary | ICD-10-CM

## 2021-03-06 DIAGNOSIS — K760 Fatty (change of) liver, not elsewhere classified: Secondary | ICD-10-CM | POA: Diagnosis not present

## 2021-03-06 DIAGNOSIS — J4520 Mild intermittent asthma, uncomplicated: Secondary | ICD-10-CM | POA: Diagnosis not present

## 2021-03-06 DIAGNOSIS — Z0000 Encounter for general adult medical examination without abnormal findings: Secondary | ICD-10-CM | POA: Diagnosis not present

## 2021-03-17 ENCOUNTER — Encounter: Payer: Self-pay | Admitting: *Deleted

## 2021-03-17 DIAGNOSIS — R310 Gross hematuria: Secondary | ICD-10-CM | POA: Diagnosis not present

## 2021-03-17 DIAGNOSIS — R300 Dysuria: Secondary | ICD-10-CM | POA: Diagnosis not present

## 2021-03-18 DIAGNOSIS — R310 Gross hematuria: Secondary | ICD-10-CM | POA: Diagnosis not present

## 2021-03-18 DIAGNOSIS — R319 Hematuria, unspecified: Secondary | ICD-10-CM | POA: Diagnosis not present

## 2021-03-18 DIAGNOSIS — K5730 Diverticulosis of large intestine without perforation or abscess without bleeding: Secondary | ICD-10-CM | POA: Diagnosis not present

## 2021-03-20 ENCOUNTER — Ambulatory Visit: Payer: Medicare Other | Admitting: Gastroenterology

## 2021-03-31 ENCOUNTER — Ambulatory Visit: Payer: Medicare Other

## 2021-04-21 ENCOUNTER — Ambulatory Visit: Payer: Medicare Other

## 2021-04-22 ENCOUNTER — Other Ambulatory Visit: Payer: Self-pay | Admitting: Internal Medicine

## 2021-04-22 DIAGNOSIS — Z1231 Encounter for screening mammogram for malignant neoplasm of breast: Secondary | ICD-10-CM

## 2021-04-23 DIAGNOSIS — R310 Gross hematuria: Secondary | ICD-10-CM | POA: Diagnosis not present

## 2021-04-28 ENCOUNTER — Ambulatory Visit: Payer: Medicare Other | Admitting: Gastroenterology

## 2021-04-28 ENCOUNTER — Telehealth: Payer: Self-pay | Admitting: Gastroenterology

## 2021-04-28 ENCOUNTER — Institutional Professional Consult (permissible substitution): Payer: Medicare Other | Admitting: Pulmonary Disease

## 2021-04-28 NOTE — Telephone Encounter (Signed)
Good Morning Dr. Tarri Glenn,   Patient called to see if she had any upcoming appointments and she did not realize that she had one today with you at 3:20.   Patient was rescheduled for 4/28 at 10:50.

## 2021-04-28 NOTE — Telephone Encounter (Signed)
Noted  

## 2021-05-01 ENCOUNTER — Other Ambulatory Visit: Payer: Self-pay | Admitting: Urology

## 2021-05-04 NOTE — Progress Notes (Signed)
COVID swab appointment: N/A ? ?COVID Vaccine Completed: ?Date COVID Vaccine completed: ?Has received booster: ?COVID vaccine manufacturer: [employer]  ? ?Date of COVID positive in last 90 days: ? ?PCP - Deland Pretty, MD ?Cardiologist -  ? ?Chest x-ray -  ?EKG -  ?Stress Test -  ?ECHO -  ?Cardiac Cath -  ?Pacemaker/ICD device last checked: ?Spinal Cord Stimulator: ? ?Bowel Prep -  ? ?Sleep Study -  ?CPAP -  ? ?Fasting Blood Sugar -  ?Checks Blood Sugar _____ times a day ? ?Blood Thinner Instructions: ?Aspirin Instructions: ?Last Dose: ? ?Activity level:  Can go up a flight of stairs and perform activities of daily living without stopping and without symptoms of chest pain or shortness of breath. ?  Able to exercise without symptoms ? ?Unable to go up a flight of stairs without symptoms of  ?   ? ?Anesthesia review:  ? ?Patient denies shortness of breath, fever, cough and chest pain at PAT appointment ? ? ?Patient verbalized understanding of instructions that were given to them at the PAT appointment. Patient was also instructed that they will need to review over the PAT instructions again at home before surgery.  ?

## 2021-05-04 NOTE — Patient Instructions (Signed)
DUE TO COVID-19 ONLY ONE VISITOR IS ALLOWED TO COME WITH YOU AND STAY IN THE WAITING ROOM ONLY DURING PRE OP AND PROCEDURE.   ?**NO VISITORS ARE ALLOWED IN THE SHORT STAY AREA OR RECOVERY ROOM!!** ? ? ?You are not required to quarantine, however you are required to wear a well-fitted mask when you are out and around people not in your household.  ?Hand Hygiene often ?Do NOT share personal items ?Notify your provider if you are in close contact with someone who has COVID or you develop fever 100.4 or greater, new onset of sneezing, cough, sore throat, shortness of breath or body aches. ?     ? Your procedure is scheduled on:  Friday, 05-15-21 ? ? Report to [facility] Main Entrance ? ?  Report to admitting at 10:30 AM ? ? Call this number if you have problems the morning of surgery [phone] ? ? Do not eat food :After Midnight. ? ? After Midnight you may have the following liquids until 9:30 AM DAY OF SURGERY ? ?Water ?Black Coffee (sugar ok, NO MILK/CREAM OR CREAMERS)  ?Tea (sugar ok, NO MILK/CREAM OR CREAMERS) regular and decaf                             ?Plain Jell-O (NO RED)                                           ?Fruit ices (not with fruit pulp, NO RED)                                     ?Popsicles (NO RED)                                                                  ?Juice: apple, WHITE grape, WHITE cranberry ?Sports drinks like Gatorade (NO RED) ?Clear broth(vegetable,chicken,beef)             ?  ?Oral Hygiene is also important to reduce your risk of infection.                                    ?Remember - BRUSH YOUR TEETH THE MORNING OF SURGERY WITH YOUR REGULAR TOOTHPASTE ? ? Do NOT smoke after Midnight ? ? Take these medicines the morning of surgery with A SIP OF WATER: Diazepam, Metoprolol ? ?DO NOT TAKE ANY ORAL DIABETIC MEDICATIONS DAY OF YOUR SURGERY ? ? Stop all vitamins and herbal supplements a week before surgery.   ? ? Stop Motrin, Aleve, Ibuprofen a week before  surgery. ? ? Bring CPAP mask and tubing day of surgery ?                  ?           You may not have any metal on your body including hair pins, jewelry, and body piercing ? ?  Do not wear make-up, lotions, powders, perfumes or deodorant ? ?Do not wear nail polish including gel and S&S, artificial/acrylic nails, or any other type of covering on natural nails including finger and toenails. If you have artificial nails, gel coating, etc. that needs to be removed by a nail salon please have this removed prior to surgery or surgery may need to be canceled/ delayed if the surgeon/ anesthesia feels like they are unable to be safely monitored.  ? ?Do not shave  48 hours prior to surgery.  ? ? Contacts, dentures or bridgework may not be worn into surgery. ? ? Do not bring valuables to the hospital. [facility]. ?  ?Patients discharged on the day of surgery will not be allowed to drive home.  Someone NEEDS to stay with you for the first 24 hours after anesthesia. ? ?Special Instructions: Bring a copy of your healthcare power of attorney and living will documents the day of surgery if you haven't scanned them before. ? ?Please read over the following fact sheets you were given: IF [place] [phone] ? ? [facility] - Preparing for Surgery ?Before surgery, you can play an important role.  Because skin is not sterile, your skin needs to be as free of germs as possible.  You can reduce the number of germs on your skin by washing with CHG (chlorahexidine gluconate) soap before surgery.  CHG is an antiseptic cleaner which kills germs and bonds with the skin to continue killing germs even after washing. ?Please DO NOT use if you have an allergy to CHG or antibacterial soaps.  If your skin becomes reddened/irritated stop using the CHG and inform your nurse when you arrive at Short Stay. ?Do not shave (including legs and underarms)  for at least 48 hours prior to the first CHG shower.  You may shave your face/neck. ? ?Please follow these instructions carefully: ? 1.  Shower with CHG Soap the night before surgery and the  morning of surgery. ? 2.  If you choose to wash your hair, wash your hair first as usual with your normal  shampoo. ? 3.  After you shampoo, rinse your hair and body thoroughly to remove the shampoo.                            ? 4.  Use CHG as you would any other liquid soap.  You can apply chg directly to the skin and wash.  Gently with a scrungie or clean washcloth. ? 5.  Apply the CHG Soap to your body ONLY FROM THE NECK DOWN.   Do   not use on face/ open      ?                     Wound or open sores. Avoid contact with eyes, ears mouth and   genitals (private parts).  ?                     Production manager,  Genitals (private parts) with your normal soap. ?            6.  Wash thoroughly, paying special attention to the area where your    surgery  will be performed. ? 7.  Thoroughly rinse your body with warm water from the neck down. ? 8.  DO NOT  shower/wash with your normal soap after using and rinsing off the CHG Soap. ?               9.  Pat yourself dry with a clean towel. ?           10.  Wear clean pajamas. ?           11.  Place clean sheets on your bed the night of your first shower and do not  sleep with pets. ?Day of Surgery : ?Do not apply any lotions/deodorants the morning of surgery.  Please wear clean clothes to the hospital/surgery center. ? ?FAILURE TO FOLLOW THESE INSTRUCTIONS MAY RESULT IN THE CANCELLATION OF YOUR SURGERY ? ?PATIENT SIGNATURE_________________________________ ? ?NURSE SIGNATURE__________________________________ ? ?________________________________________________________________________  ?

## 2021-05-06 ENCOUNTER — Encounter (HOSPITAL_COMMUNITY)
Admission: RE | Admit: 2021-05-06 | Discharge: 2021-05-06 | Disposition: A | Discharge status: Home / Self Care | Payer: Medicare Other | Source: Ambulatory Visit | Attending: Anesthesiology | Admitting: Anesthesiology

## 2021-05-06 DIAGNOSIS — I2510 Atherosclerotic heart disease of native coronary artery without angina pectoris: Secondary | ICD-10-CM

## 2021-05-06 DIAGNOSIS — Z01818 Encounter for other preprocedural examination: Secondary | ICD-10-CM

## 2021-05-07 ENCOUNTER — Ambulatory Visit: Payer: Medicare Other

## 2021-05-12 ENCOUNTER — Other Ambulatory Visit: Payer: Self-pay

## 2021-05-12 ENCOUNTER — Encounter (HOSPITAL_COMMUNITY): Payer: Self-pay

## 2021-05-12 ENCOUNTER — Encounter (HOSPITAL_COMMUNITY)
Admission: RE | Admit: 2021-05-12 | Discharge: 2021-05-12 | Disposition: A | Discharge status: Home / Self Care | Payer: Medicare Other | Source: Ambulatory Visit | Attending: Urology | Admitting: Urology

## 2021-05-12 DIAGNOSIS — Z01812 Encounter for preprocedural laboratory examination: Secondary | ICD-10-CM | POA: Diagnosis not present

## 2021-05-12 HISTORY — DX: Personal history of urinary calculi: Z87.442

## 2021-05-12 HISTORY — DX: Anxiety disorder, unspecified: F41.9

## 2021-05-12 NOTE — Patient Instructions (Addendum)
DUE TO COVID-19 ONLY ONE VISITOR IS ALLOWED TO COME WITH YOU AND STAY IN THE WAITING ROOM ONLY DURING PRE OP AND PROCEDURE.   ?**NO VISITORS ARE ALLOWED IN THE SHORT STAY AREA OR RECOVERY ROOM!!** ?     ? Your procedure is scheduled on: 05/15/21 ? ? Report to [facility] Main Entrance ? ?  Report to admitting at 10:30 AM ? ? Call this number if you have problems the morning of surgery [phone] ? ? Do not eat food :After Midnight. ? ? After Midnight you may have the following liquids until 9:45 AM DAY OF SURGERY ? ?Water ?Black Coffee (sugar ok, NO MILK/CREAM OR CREAMERS)  ?Tea (sugar ok, NO MILK/CREAM OR CREAMERS) regular and decaf                             ?Plain Jell-O (NO RED)                                           ?Fruit ices (not with fruit pulp, NO RED)                                     ?Popsicles (NO RED)                                                                  ?Juice: apple, WHITE grape, WHITE cranberry ?Sports drinks like Gatorade (NO RED) ?Clear broth(vegetable,chicken,beef) ? ?FOLLOW BOWEL PREP AND ANY ADDITIONAL PRE OP INSTRUCTIONS YOU RECEIVED FROM YOUR SURGEON'S OFFICE!!! ?  ?  ?Oral Hygiene is also important to reduce your risk of infection.                                    ?Remember - BRUSH YOUR TEETH THE MORNING OF SURGERY WITH YOUR REGULAR TOOTHPASTE ? ? Take these medicines the morning of surgery with A SIP OF WATER: Diazepam, Metoprolol  ?                  ?           You may not have any metal on your body including hair pins, jewelry, and body piercing ? ?           Do not wear make-up, lotions, powders, perfumes, or deodorant ? ?Do not wear nail polish including gel and S&S, artificial/acrylic nails, or any other type of covering on natural nails including finger and toenails. If you have artificial nails, gel coating, etc. that needs to be removed by a nail salon please have this removed prior to surgery or surgery may need to be canceled/ delayed if the surgeon/  anesthesia feels like they are unable to be safely monitored.  ? ?Do not shave  48 hours prior to surgery.  ? ? Do not bring valuables to the hospital. [facility] NOT ?            RESPONSIBLE  FOR VALUABLES. ?  ? Patients discharged on the day of surgery will not be allowed to drive home.  Someone NEEDS to stay with you for the first 24 hours after anesthesia. ? ? Special Instructions: Bring a copy of your healthcare power of attorney and living will documents         the day of surgery if you haven't scanned them before. ? ?            Please read over the following fact sheets you were given: IF [place] [phone]- Apolonio Schneiders ? ?   [facility] - Preparing for Surgery ?Before surgery, you can play an important role.  Because skin is not sterile, your skin needs to be as free of germs as possible.  You can reduce the number of germs on your skin by washing with CHG (chlorahexidine gluconate) soap before surgery.  CHG is an antiseptic cleaner which kills germs and bonds with the skin to continue killing germs even after washing. ?Please DO NOT use if you have an allergy to CHG or antibacterial soaps.  If your skin becomes reddened/irritated stop using the CHG and inform your nurse when you arrive at Short Stay. ?Do not shave (including legs and underarms) for at least 48 hours prior to the first CHG shower.  You may shave your face/neck. ? ?Please follow these instructions carefully: ? 1.  Shower with CHG Soap the night before surgery and the  morning of surgery. ? 2.  If you choose to wash your hair, wash your hair first as usual with your normal  shampoo. ? 3.  After you shampoo, rinse your hair and body thoroughly to remove the shampoo.                            ? 4.  Use CHG as you would any other liquid soap.  You can apply chg directly to the skin and wash.  Gently with a scrungie or clean washcloth. ? 5.  Apply the CHG Soap to your body ONLY FROM THE  NECK DOWN.   Do   not use on face/ open      ?                     Wound or open sores. Avoid contact with eyes, ears mouth and   genitals (private parts).  ?                     Production manager,  Genitals (private parts) with your normal soap. ?            6.  Wash thoroughly, paying special attention to the area where your    surgery  will be performed. ? 7.  Thoroughly rinse your body with warm water from the neck down. ? 8.  DO NOT shower/wash with your normal soap after using and rinsing off the CHG Soap. ?               9.  Pat yourself dry with a clean towel. ?           10.  Wear clean pajamas. ?           11.  Place clean sheets on your bed the night of your first shower and do not  sleep with pets. ?Day of Surgery : ?Do not apply any lotions/deodorants  the morning of surgery.  Please wear clean clothes to the hospital/surgery center. ? ?FAILURE TO FOLLOW THESE INSTRUCTIONS MAY RESULT IN THE CANCELLATION OF YOUR SURGERY ? ?PATIENT SIGNATURE_________________________________ ? ?NURSE SIGNATURE__________________________________ ? ?________________________________________________________________________  ?

## 2021-05-12 NOTE — Progress Notes (Addendum)
Patient interviewed over the phone. She is coming in for labs 05/13/21 at 1500. Instructions gone over verbally and will provide physical copy when she comes in for labs. ? ?COVID swab appointment: N/A ?  ?COVID Vaccine Completed: yes x3 ?Date COVID Vaccine completed: ?Has received booster: ?COVID vaccine manufacturer: Pfizer    ?  ?Date of COVID positive in last 90 days: no ?  ?PCP - Deland Pretty, MD ?Cardiologist - n/a ?  ?Chest x-ray - n/a ?EKG -  ?Stress Test - n/a ?ECHO - n/a ?Cardiac Cath - n/a ?Pacemaker/ICD device last checked: n/a ?Spinal Cord Stimulator: n/a ?  ?Bowel Prep - no ?  ?Sleep Study - yes, positive ?CPAP - no ?  ?Fasting Blood Sugar - n/a ?Checks Blood Sugar _____ times a day ?  ?Blood Thinner Instructions: n/a ?Aspirin Instructions: hasn't taken in months ?Last Dose: ?  ?Activity level:   Can go up a flight of stairs and perform activities of daily living without stopping and without symptoms of chest pain or shortness of breath. ?                         ?Anesthesia review:  ?  ?Patient denies shortness of breath, fever, cough and chest pain at PAT appointment ?  ?  ?Patient verbalized understanding of instructions that were given to them at the PAT appointment. Patient was also instructed that they will need to review over the PAT instructions again at home before surgery. ?

## 2021-05-13 ENCOUNTER — Encounter (HOSPITAL_COMMUNITY): Admission: RE | Admit: 2021-05-13 | Payer: Medicare Other | Source: Ambulatory Visit

## 2021-05-14 ENCOUNTER — Encounter (HOSPITAL_COMMUNITY)
Admission: RE | Admit: 2021-05-14 | Discharge: 2021-05-14 | Disposition: A | Discharge status: Home / Self Care | Payer: Medicare Other | Source: Ambulatory Visit | Attending: Urology | Admitting: Urology

## 2021-05-14 ENCOUNTER — Other Ambulatory Visit: Payer: Self-pay

## 2021-05-14 DIAGNOSIS — Z01818 Encounter for other preprocedural examination: Secondary | ICD-10-CM | POA: Insufficient documentation

## 2021-05-14 DIAGNOSIS — I2510 Atherosclerotic heart disease of native coronary artery without angina pectoris: Secondary | ICD-10-CM | POA: Diagnosis not present

## 2021-05-14 LAB — CBC
HCT: 41.3 % (ref 36.0–46.0)
Hemoglobin: 13.8 g/dL (ref 12.0–15.0)
MCH: 28.5 pg (ref 26.0–34.0)
MCHC: 33.4 g/dL (ref 30.0–36.0)
MCV: 85.2 fL (ref 80.0–100.0)
Platelets: 335 10*3/uL (ref 150–400)
RBC: 4.85 MIL/uL (ref 3.87–5.11)
RDW: 12.8 % (ref 11.5–15.5)
WBC: 3.4 10*3/uL — ABNORMAL LOW (ref 4.0–10.5)
nRBC: 0 % (ref 0.0–0.2)

## 2021-05-14 LAB — BASIC METABOLIC PANEL
Anion gap: 9 (ref 5–15)
BUN: 12 mg/dL (ref 6–20)
CO2: 26 mmol/L (ref 22–32)
Calcium: 9.5 mg/dL (ref 8.9–10.3)
Chloride: 103 mmol/L (ref 98–111)
Creatinine, Ser: 1.02 mg/dL — ABNORMAL HIGH (ref 0.44–1.00)
GFR, Estimated: >60 mL/min (ref >60)
Glucose, Bld: 105 mg/dL — ABNORMAL HIGH (ref 70–99)
Potassium: 3.3 mmol/L — ABNORMAL LOW (ref 3.5–5.1)
Sodium: 138 mmol/L (ref 135–145)

## 2021-05-15 ENCOUNTER — Ambulatory Visit (HOSPITAL_COMMUNITY): Payer: Medicare Other | Admitting: Anesthesiology

## 2021-05-15 ENCOUNTER — Encounter (HOSPITAL_COMMUNITY): Payer: Self-pay | Admitting: Urology

## 2021-05-15 ENCOUNTER — Encounter (HOSPITAL_COMMUNITY)
Admission: RE | Disposition: A | Discharge status: Home / Self Care | Payer: Self-pay | Source: Home / Self Care | Attending: Urology

## 2021-05-15 ENCOUNTER — Ambulatory Visit (HOSPITAL_COMMUNITY): Payer: Medicare Other

## 2021-05-15 ENCOUNTER — Ambulatory Visit (HOSPITAL_COMMUNITY)
Admission: RE | Admit: 2021-05-15 | Discharge: 2021-05-15 | Disposition: A | Discharge status: Home / Self Care | Payer: Medicare Other | Attending: Urology | Admitting: Urology

## 2021-05-15 ENCOUNTER — Ambulatory Visit (HOSPITAL_BASED_OUTPATIENT_CLINIC_OR_DEPARTMENT_OTHER): Payer: Medicare Other | Admitting: Anesthesiology

## 2021-05-15 DIAGNOSIS — Z79899 Other long term (current) drug therapy: Secondary | ICD-10-CM | POA: Insufficient documentation

## 2021-05-15 DIAGNOSIS — N1330 Unspecified hydronephrosis: Secondary | ICD-10-CM | POA: Diagnosis not present

## 2021-05-15 DIAGNOSIS — I10 Essential (primary) hypertension: Secondary | ICD-10-CM

## 2021-05-15 DIAGNOSIS — G4730 Sleep apnea, unspecified: Secondary | ICD-10-CM | POA: Insufficient documentation

## 2021-05-15 DIAGNOSIS — Z7984 Long term (current) use of oral hypoglycemic drugs: Secondary | ICD-10-CM

## 2021-05-15 DIAGNOSIS — M1990 Unspecified osteoarthritis, unspecified site: Secondary | ICD-10-CM

## 2021-05-15 DIAGNOSIS — K219 Gastro-esophageal reflux disease without esophagitis: Secondary | ICD-10-CM | POA: Diagnosis not present

## 2021-05-15 DIAGNOSIS — R310 Gross hematuria: Secondary | ICD-10-CM | POA: Insufficient documentation

## 2021-05-15 DIAGNOSIS — N2889 Other specified disorders of kidney and ureter: Secondary | ICD-10-CM | POA: Diagnosis not present

## 2021-05-15 HISTORY — PX: CYSTOSCOPY/RETROGRADE/URETEROSCOPY: SHX5316

## 2021-05-15 SURGERY — CYSTOSCOPY/RETROGRADE/URETEROSCOPY
Anesthesia: General | Laterality: Bilateral

## 2021-05-15 MED ORDER — PROPOFOL 10 MG/ML IV BOLUS
INTRAVENOUS | Status: AC
  Filled 2021-05-15: qty 20

## 2021-05-15 MED ORDER — MIDAZOLAM HCL 5 MG/5ML IJ SOLN
INTRAMUSCULAR | Status: DC | PRN
  Administered 2021-05-15: 2 mg via INTRAVENOUS

## 2021-05-15 MED ORDER — PROPOFOL 10 MG/ML IV BOLUS
INTRAVENOUS | Status: DC | PRN
  Administered 2021-05-15: 200 mg via INTRAVENOUS

## 2021-05-15 MED ORDER — IOHEXOL 300 MG/ML  SOLN
INTRAMUSCULAR | Status: DC | PRN
  Administered 2021-05-15: 15 mL

## 2021-05-15 MED ORDER — LIDOCAINE 2% (20 MG/ML) 5 ML SYRINGE
INTRAMUSCULAR | Status: DC | PRN
  Administered 2021-05-15: 100 mg via INTRAVENOUS

## 2021-05-15 MED ORDER — MIDAZOLAM HCL 2 MG/2ML IJ SOLN
INTRAMUSCULAR | Status: AC
  Filled 2021-05-15: qty 2

## 2021-05-15 MED ORDER — ACETAMINOPHEN 500 MG PO TABS
1000.0000 mg | ORAL_TABLET | Freq: Once | ORAL | Status: DC
  Filled 2021-05-15: qty 2

## 2021-05-15 MED ORDER — PHENYLEPHRINE 40 MCG/ML (10ML) SYRINGE FOR IV PUSH (FOR BLOOD PRESSURE SUPPORT)
PREFILLED_SYRINGE | INTRAVENOUS | Status: DC | PRN
  Administered 2021-05-15: 200 ug via INTRAVENOUS

## 2021-05-15 MED ORDER — CHLORHEXIDINE GLUCONATE 0.12 % MT SOLN
15.0000 mL | Freq: Once | OROMUCOSAL | Status: AC
Start: 2021-05-15 — End: 2021-05-15
  Administered 2021-05-15: 15 mL via OROMUCOSAL

## 2021-05-15 MED ORDER — FENTANYL CITRATE PF 50 MCG/ML IJ SOSY
PREFILLED_SYRINGE | INTRAMUSCULAR | Status: AC
  Filled 2021-05-15: qty 2

## 2021-05-15 MED ORDER — FENTANYL CITRATE PF 50 MCG/ML IJ SOSY
PREFILLED_SYRINGE | INTRAMUSCULAR | Status: AC
  Filled 2021-05-15: qty 1

## 2021-05-15 MED ORDER — CIPROFLOXACIN IN D5W 400 MG/200ML IV SOLN
400.0000 mg | INTRAVENOUS | Status: AC
  Administered 2021-05-15: 400 mg via INTRAVENOUS
  Filled 2021-05-15: qty 200

## 2021-05-15 MED ORDER — FENTANYL CITRATE PF 50 MCG/ML IJ SOSY
25.0000 ug | PREFILLED_SYRINGE | INTRAMUSCULAR | Status: DC | PRN
  Administered 2021-05-15 (×3): 50 ug via INTRAVENOUS

## 2021-05-15 MED ORDER — FENTANYL CITRATE (PF) 100 MCG/2ML IJ SOLN
INTRAMUSCULAR | Status: AC
  Filled 2021-05-15: qty 2

## 2021-05-15 MED ORDER — LACTATED RINGERS IV SOLN: INTRAVENOUS | Status: DC

## 2021-05-15 MED ORDER — SODIUM CHLORIDE 0.9 % IR SOLN
Status: DC | PRN
  Administered 2021-05-15: 3000 mL

## 2021-05-15 MED ORDER — DEXAMETHASONE SODIUM PHOSPHATE 10 MG/ML IJ SOLN
INTRAMUSCULAR | Status: DC | PRN
  Administered 2021-05-15: 5 mg via INTRAVENOUS

## 2021-05-15 MED ORDER — FENTANYL CITRATE (PF) 100 MCG/2ML IJ SOLN
INTRAMUSCULAR | Status: DC | PRN
  Administered 2021-05-15: 100 ug via INTRAVENOUS

## 2021-05-15 MED ORDER — ORAL CARE MOUTH RINSE
15.0000 mL | Freq: Once | OROMUCOSAL | Status: AC
Start: 2021-05-15 — End: 2021-05-15

## 2021-05-15 MED ORDER — TRAMADOL HCL 50 MG PO TABS: 50.0000 mg | ORAL_TABLET | Freq: Four times a day (QID) | ORAL | 0 refills | Status: AC | PRN

## 2021-05-15 MED ORDER — ONDANSETRON HCL 4 MG/2ML IJ SOLN
INTRAMUSCULAR | Status: DC | PRN
  Administered 2021-05-15: 4 mg via INTRAVENOUS

## 2021-05-15 SURGICAL SUPPLY — 21 items
BAG URO CATCHER STRL LF (MISCELLANEOUS) ×2 IMPLANT
BASKET LASER NITINOL 1.9FR (BASKET) IMPLANT
BASKET ZERO TIP NITINOL 2.4FR (BASKET) IMPLANT
CATH URETL OPEN END 6FR 70 (CATHETERS) ×2 IMPLANT
CLOTH BEACON ORANGE TIMEOUT ST (SAFETY) ×2 IMPLANT
EXTRACTOR STONE 1.7FRX115CM (UROLOGICAL SUPPLIES) IMPLANT
GLOVE SURG ENC MOIS LTX SZ7.5 (GLOVE) ×1 IMPLANT
GOWN STRL REUS W/TWL XL LVL3 (GOWN DISPOSABLE) ×2 IMPLANT
GUIDEWIRE ANG ZIPWIRE 038X150 (WIRE) IMPLANT
GUIDEWIRE STR DUAL SENSOR (WIRE) ×3 IMPLANT
KIT TURNOVER KIT A (KITS) IMPLANT
LASER FIB FLEXIVA PULSE ID 365 (Laser) IMPLANT
MANIFOLD NEPTUNE II (INSTRUMENTS) ×2 IMPLANT
PACK CYSTO (CUSTOM PROCEDURE TRAY) ×2 IMPLANT
SHEATH NAVIGATOR HD 11/13X28 (SHEATH) IMPLANT
SHEATH NAVIGATOR HD 11/13X36 (SHEATH) ×1 IMPLANT
STENT URET 6FRX26 CONTOUR (STENTS) ×1 IMPLANT
TRACTIP FLEXIVA PULS ID 200XHI (Laser) IMPLANT
TRACTIP FLEXIVA PULSE ID 200 (Laser)
TUBING CONNECTING 10 (TUBING) ×2 IMPLANT
TUBING UROLOGY SET (TUBING) ×2 IMPLANT

## 2021-05-15 NOTE — Anesthesia Procedure Notes (Signed)
Procedure Name: LMA Insertion ?Date/Time: 05/15/2021 1:02 PM ?Performed by: Gean Maidens, CRNA ?Pre-anesthesia Checklist: Patient identified, Emergency Drugs available, Suction available, Patient being monitored and Timeout performed ?Patient Re-evaluated:Patient Re-evaluated prior to induction ?Oxygen Delivery Method: Circle system utilized ?Preoxygenation: Pre-oxygenation with 100% oxygen ?Induction Type: IV induction ?Ventilation: Mask ventilation without difficulty ?LMA: LMA inserted ?LMA Size: 4.0 ?Number of attempts: 1 ?Placement Confirmation: positive ETCO2 and breath sounds checked- equal and bilateral ?Tube secured with: Tape ?Dental Injury: Teeth and Oropharynx as per pre-operative assessment  ? ? ? ? ?

## 2021-05-15 NOTE — Discharge Instructions (Signed)

## 2021-05-15 NOTE — Transfer of Care (Signed)
Immediate Anesthesia Transfer of Care Note ? ?Patient: Kelsey Ortega ? ?Procedure(s) Performed: CYSTOSCOPY BILATERAL RETROGRADE ,PYELOGRAM RIGHT DIAGNOSTIC URETEROSCOPY/STENT PLACEMENT (Bilateral) ? ?Patient Location: PACU ? ?Anesthesia Type:General ? ?Level of Consciousness: awake, alert  and oriented ? ?Airway & Oxygen Therapy: Patient Spontanous Breathing and Patient connected to face mask oxygen ? ?Post-op Assessment: Report given to RN and Post -op Vital signs reviewed and stable ? ?Post vital signs: Reviewed and stable ? ?Last Vitals:  ?Vitals Value Taken Time  ?BP 145/102 05/15/21 1345  ?Temp    ?Pulse 80 05/15/21 1347  ?Resp 21 05/15/21 1347  ?SpO2 92 % 05/15/21 1347  ?Vitals shown include unvalidated device data. ? ?Last Pain:  ?Vitals:  ? 05/15/21 1214  ?TempSrc:   ?PainSc: 0-No pain  ?   ? ?  ? ?Complications: No notable events documented. ?

## 2021-05-15 NOTE — Anesthesia Postprocedure Evaluation (Signed)
Anesthesia Post Note ? ?Patient: Kelsey Ortega ? ?Procedure(s) Performed: CYSTOSCOPY BILATERAL RETROGRADE ,PYELOGRAM RIGHT DIAGNOSTIC URETEROSCOPY/STENT PLACEMENT (Bilateral) ? ?  ? ?Patient location during evaluation: PACU ?Anesthesia Type: General ?Level of consciousness: awake and alert ?Pain management: pain level controlled ?Vital Signs Assessment: post-procedure vital signs reviewed and stable ?Respiratory status: spontaneous breathing, nonlabored ventilation, respiratory function stable and patient connected to nasal cannula oxygen ?Cardiovascular status: blood pressure returned to baseline and stable ?Postop Assessment: no apparent nausea or vomiting ?Anesthetic complications: no ? ? ?No notable events documented. ? ?Last Vitals:  ?Vitals:  ? 05/15/21 1415 05/15/21 1425  ?BP: 133/71 (!) 145/83  ?Pulse: 72 82  ?Resp: 14   ?Temp: (!) 36.3 ?C   ?SpO2: 97% 92%  ?  ?Last Pain:  ?Vitals:  ? 05/15/21 1415  ?TempSrc:   ?PainSc: 0-No pain  ? ? ?  ?  ?  ?  ?  ?  ? ?[name] ? ? ? ? ?

## 2021-05-15 NOTE — Op Note (Signed)
Operative Note ? ?Preoperative diagnosis:  ?1.  Gross hematuria ? ?Postoperative diagnosis: ?1.  Gross hematuria ? ?Procedure(s): ?1.  Cystoscopy with bilateral retrograde pyelogram, right diagnostic ureteroscopy, ureteral stent placement ? ?Surgeon: Link Snuffer, MD ? ?Assistants: None ? ?Anesthesia: General LMA ? ?Complications: None immediate ? ?EBL: Minimal ? ?Specimens: ?1.  None ? ?Drains/Catheters: ?1.  6 x 26 double-J ureteral stent on the right ? ?Intraoperative findings: 1.  Normal urethra and bladder 2.  Left retrograde pyelogram normal without filling defect or hydronephrosis 3.  Right retrograde pyelogram revealed no obvious filling defect or hydronephrosis.  Diagnostic ureteroscopy revealed no tumor or stone. ? ?Indication: 61 year old female who underwent a hematuria work-up was found on CT hematuria protocol to have right infundibular stenosis with some proximal hydronephrosis.  Given this abnormal finding, diagnostic ureteroscopy was recommended to rule out tumor given her gross hematuria. ? ?Description of procedure: ? ?The patient was identified and consent was obtained.  The patient was taken to the operating room and placed in the supine position.  The patient was placed under general anesthesia.  Perioperative antibiotics were administered.  The patient was placed in dorsal lithotomy.  Patient was prepped and draped in a standard sterile fashion and a timeout was performed. ? ?A 21 French rigid cystoscope was advanced into the urethra and into the bladder.  Complete cystoscopy was performed with no abnormal findings.  The left ureter was cannulated with an open-ended ureteral catheter and a retrograde pyelogram was performed with no abnormal findings.  The right ureter was cannulated with an open-ended ureteral catheter and a retrograde pyelogram was performed and again appeared normal.  I wire was advanced up the right ureter and into the kidney under fluoroscopic guidance.  A second wire was  advanced alongside this and into the kidney.  1 the wires was secured to the drape and the other wire was used to advance an 11 x 13 ureteral access sheath over the wire under continuous fluoroscopic guidance.  Digital ureteroscopy was performed and I inspected the entire kidney.  There was no stone or mass seen.  There is no significant stenosis of any portion of the collecting system.  I was able to inspect all areas directly.  I again shot a retrograde pyelogram through the scope and again confirmed that I had inspected the entire kidney especially the place that looked abnormal on CT.  I withdrew the scope along with the access sheath visualizing the entire ureter upon removal.  There is no obvious injury to the ureter and there were no ureteral stones or tumors.  I backloaded the wire onto rigid cystoscope and advanced that into the bladder followed by routine placement of a 6 x 26 double-J ureteral stent.  Fluoroscopy confirmed proximal placement and direct visualization confirmed a good coil within the bladder. ? ?Plan: Follow-up in 1 week for stent removal.  Hematuria work-up is negative. ? ?

## 2021-05-15 NOTE — Anesthesia Preprocedure Evaluation (Addendum)
Anesthesia Evaluation  ?Patient identified by MRN, date of birth, ID band ?Patient awake ? ? ? ?Reviewed: ?Allergy & Precautions, NPO status , Patient's Chart, lab work & pertinent test results, reviewed documented beta blocker date and time  ? ?Airway ?Mallampati: III ? ?TM Distance: >3 FB ?Neck ROM: Full ? ? ? Dental ?no notable dental hx. ?(+) Teeth Intact, Dental Advisory Given ?  ?Pulmonary ?sleep apnea (no CPAP) ,  ?  ?Pulmonary exam normal ?breath sounds clear to auscultation ? ? ? ? ? ? Cardiovascular ?hypertension, Pt. on home beta blockers and Pt. on medications ?Normal cardiovascular exam ?Rhythm:Regular Rate:Normal ? ? ?  ?Neuro/Psych ? Headaches, PSYCHIATRIC DISORDERS Anxiety Depression   ? GI/Hepatic ?Neg liver ROS, GERD  ,  ?Endo/Other  ?PCOS ? Renal/GU ?negative Renal ROS  ?negative genitourinary ?  ?Musculoskeletal ? ?(+) Arthritis ,  ? Abdominal ?  ?Peds ? Hematology ?negative hematology ROS ?(+)   ?Anesthesia Other Findings ? ? Reproductive/Obstetrics ? ?  ? ? ? ? ? ? ? ? ? ? ? ? ? ?  ?  ? ? ? ? ? ? ? ?Anesthesia Physical ?Anesthesia Plan ? ?ASA: 2 ? ?Anesthesia Plan: General  ? ?Post-op Pain Management: Tylenol PO (pre-op)*  ? ?Induction: Intravenous ? ?PONV Risk Score and Plan: 3 and Ondansetron, Dexamethasone and Midazolam ? ?Airway Management Planned: LMA ? ?Additional Equipment:  ? ?Intra-op Plan:  ? ?Post-operative Plan: Extubation in OR ? ?Informed Consent: I have reviewed the patients History and Physical, chart, labs and discussed the procedure including the risks, benefits and alternatives for the proposed anesthesia with the patient or authorized representative who has indicated his/her understanding and acceptance.  ? ? ? ?Dental advisory given ? ?Plan Discussed with: CRNA ? ?Anesthesia Plan Comments:   ? ? ? ? ? ? ?Anesthesia Quick Evaluation ? ?

## 2021-05-15 NOTE — H&P (Signed)
CC/HPI: Patient saw my partner many months ago. She was having blood with wiping and Dr. Jeffie Pollock thought it was vaginal atrophy and gave her Premarin cream. She has had multiple no-show visits with myself and him. Patient noted blood in urine or at least in the bowl this morning and also with wiping. She was having suprapubic discomfort but then it went away. In the last week she felt that she needed to urinate a little bit more frequently and would have to do some positional changes in order to try to void.  ? ?No history of smoking or daily aspirin or blood thinner  ? ?She has rare urge incontinence that she drinks soda and normally does not wear a pad. She voids every 1-2 hours gets up once at night. Normally her flow was good  ? ?The urethral discomfort that she had many months ago got better on local estrogen cream which she stopped. She feels a little bit of discomfort in that area today.  ? ?She is on metformin. She takes laxatives for bowel issues. No hysterectomy  ? ?No history of kidney stones or bladder surgery  ? ?Urine normal with no blood. Urine sent for culture. Chart reviewed. Bladder scan 30 mL  ? ?Patient may or may not be having gross hematuria. Work-up described.  ? ?On physical examination her urethra looked a little bit congested near the meatus on both sides. At first I thought she might even have a little bit of drainage at 9:00 but there was no other signs of infection or skene gland cyst or urethral diverticulum. Urethra well supported with no prolapse. I did a double speculum exam. She had a lot of discharge in the upper vault but no blood. When I touched at 9:00 with a Q-tip she was reproducibly tender but there is no sinus.  ? ?I cystoscoped the patient for many minutes. There was a little bit of blood on the trigone. I looked around the bladder 2 or 3 times I did not see any pathology. I looked at ureteral orifices for many moments and there was no obvious blood. Her urethra looked quite  hyperemic. No carcinoma. No obvious cystitis or erythema.  ? ?Recognizing and normal urinalysis I will give her Bactrim DS 1 tablet twice a day for 7 days. She will restart estrogen cream. I will order CT scan and call if results abnormal. See nurse practitioner in 3 weeks to follow-up. Recommended to see gynecology if CT scan normal jut to make sure. Based upon the cystoscopic and pelvic examination it appears that she may have urethritis which is similar to the diagnosis that Dr. Jeffie Pollock gave her in the past. Estrogen cream also prescribed  ? ?04/23/2021: 61 year old female who presents today for follow-up after undergoing CT hematuria evaluation. She reports that she has had some small blood clots noted in her urine in the interval but this is since resolved. She does endorse some interval burning prior to urination and occasionally after. She had a negative cystoscopy. Her recent CT hematuria evaluation showed right infundibular stenosis with some proximal hydronephrosis. After discussion with her and Dr. Gloriann Loan, it was decided that a diagnostic ureteroscopy would be advisable.  ? ?  ?ALLERGIES: Latex ?Penicillin ?  ? ?MEDICATIONS: Metformin Hcl  ?Metoprolol Tartrate 50 mg tablet  ?Diazepam  ?Potassium Chloride  ?Premarin 0.625 mg/gram cream with applicator 2.7OJ (pea sized amount) applied with the applicator or tip of the finger vaginally 2-3 x weekly  ?Triamterene-Hydrochlorothiazid 37.5 mg-25 mg tablet  ?  ? ?  GU PSH: Cystoscopy - 03/17/2021 ?Locm 300-'399Mg'$ /Ml Iodine,1Ml - 03/18/2021 ? ?  ?   ?PSH Notes: Uterus procedure   ? ?NON-GU PSH: Bilateral Tubal Ligation ?Cholecystectomy (laparoscopic) ? ?  ? ?GU PMH: Gross hematuria - 03/18/2021, - 03/17/2021 ?Dysuria - 03/17/2021, - 05/19/2020 ?Postmenopausal atrophic vaginitis, She has urethral pain with significant vaginal atrophy. She had some bleeding with wiping that was probably from the atrophy. I am going to start her on Premarin cream and gave her samples with  instruction and then will send a script. Side effects reviewed. She will return in 4-6 weeks. - 05/19/2020 ?  ?   ?PMH Notes:  ?1898-03-08 00:00:00 - Note: Normal Routine History And Physical Adult  ? ?NON-GU PMH: Encounter for general adult medical examination without abnormal findings, Normal routine physical examination - 2014 ?Anxiety ?Hypertension ?  ? ?FAMILY HISTORY: 1 son - Other ?Hypertension - Runs in Family ?Prostate Cancer - Grandfather  ? ?SOCIAL HISTORY: Marital Status: Single ?Preferred Language: [place]; Race: Black or African American ?Current Smoking Status: Patient has never smoked.  ? ?Tobacco Use Assessment Completed: Used Tobacco in last 30 days? ?Drinks 1 caffeinated drink per day. ?  ? ?REVIEW OF SYSTEMS:    ?GU Review Female:   Patient reports burning /pain with urination. Patient denies frequent urination, hard to postpone urination, get up at night to urinate, leakage of urine, stream starts and stops, trouble starting your stream, have to strain to urinate, and being pregnant.  ?Gastrointestinal (Upper):   Patient denies nausea, vomiting, and indigestion/ heartburn.  ?Gastrointestinal (Lower):   Patient denies diarrhea and constipation.  ?Constitutional:   Patient denies fever, night sweats, weight loss, and fatigue.  ?Skin:   Patient denies skin rash/ lesion and itching.  ?Musculoskeletal:   Patient denies joint pain and back pain.  ?Neurological:   Patient denies headaches and dizziness.  ?Psychologic:   Patient denies depression and anxiety.  ? ?VITAL SIGNS:    ?  04/23/2021 02:31 PM  ?BP 108/75 mmHg  ?Pulse 84 /min  ?Temperature 96.6 F / 35.8 C  ? ?MULTI-SYSTEM PHYSICAL EXAMINATION:    ?Constitutional: Well-nourished. No physical deformities. Normally developed. Good grooming.  ?Cardiovascular: Normal temperature, normal extremity pulses, no swelling, no varicosities.  ?Skin: No paleness, no jaundice, no cyanosis. No lesion, no ulcer, no rash.  ?Neurologic / Psychiatric: Oriented to  time, oriented to place, oriented to person. No depression, no anxiety, no agitation.  ?Gastrointestinal: No mass, no tenderness, no rigidity, non obese abdomen.  ? ?  ?Complexity of Data:  ?Source Of History:  Patient  ?Records Review:   Previous Doctor Records, Previous Patient Records  ?Urine Test Review:   Urinalysis  ?X-Ray Review: C.T. Hematuria: Reviewed Films. Reviewed Report. Discussed With Patient.  ?  ? ?PROCEDURES: None  ? ?ASSESSMENT:  ?    ICD-10 Details  ?1 GU:   Gross hematuria - A83.4 Acute, Uncomplicated  ? ?PLAN:    ? ?      Schedule ?Return Visit/Planned Activity: Next Available Appointment - Schedule Surgery  ? ? ?      Document ?Letter(s):  Created for Patient: Clinical Summary  ? ? ?     Notes:   CT hematuria results were discussed in detail. Erring on the side of caution, we discussed undergoing cystoscopy with bilateral retrograde pyelograms and a right diagnostic ureteroscopy due to finding of right infundibular stenosis with some proximal right-sided hydronephrosis. Understanding that this may be completely benign, she opted to proceed. A surgical  posting sheet will be handed to the scheduler and the patient was instructed on risks of procedure.   ? ? ?Signed by Daine Gravel, NP on 04/23/21 at 5:06 PM (EST ? ?

## 2021-05-16 ENCOUNTER — Encounter (HOSPITAL_COMMUNITY): Payer: Self-pay | Admitting: Urology

## 2021-05-25 ENCOUNTER — Inpatient Hospital Stay: Admission: RE | Admit: 2021-05-25 | Payer: Medicare Other | Source: Ambulatory Visit

## 2021-05-26 DIAGNOSIS — R310 Gross hematuria: Secondary | ICD-10-CM | POA: Diagnosis not present

## 2021-07-01 ENCOUNTER — Telehealth: Payer: Self-pay | Admitting: Gastroenterology

## 2021-07-01 NOTE — Telephone Encounter (Signed)
Returned pt call to inquire further about her symptoms. States her rectal bleeding started today. Has also felt like she needs to strain to defecate at times. States she is drinking more than 64 ounces of fluid daily and has been having concerns with hemorrhoids. States she does take a stool softener daily. Declined my offer to move her appt to a sooner date with an APP who could perform rectal exam. States she prefers to be seen by Dr. Tarri Glenn and would like to keep her appt as scheduled. Provided the following recommendations: ? ?Continue to drink 64 ounces or more of fluid daily ?May increase stool softener to BID with goal to have 1 large soft stool daily ?Add 1 dose of Miralax BID with goal to have 1 large soft stool daily. Reduce to daily dosing if stools become loose. ?Perform sitz bath as needed. Education provided ?Use moistened wipes for rectal care ?May use OTC Recticare or PrepH prn for hemorrhoidal care ?If symptoms worsen, advised to call and reschedule appt to a sooner date for better evaluation and treatment of hemorrhoids ? ?Routing this message to Dr. Tarri Glenn for continuity of care ?

## 2021-07-01 NOTE — Telephone Encounter (Signed)
Inbound call from patient wanting to make an appointment with Dr. Tarri Glenn for blood in stool. Patient was scheduled for 5/17 at 11:10. In the meantime patient is seeking advice in what she can do to help this matter. Please advise.  ?

## 2021-07-03 ENCOUNTER — Ambulatory Visit: Payer: Medicare Other | Admitting: Gastroenterology

## 2021-07-14 DIAGNOSIS — R1084 Generalized abdominal pain: Secondary | ICD-10-CM | POA: Diagnosis not present

## 2021-07-14 DIAGNOSIS — R310 Gross hematuria: Secondary | ICD-10-CM | POA: Diagnosis not present

## 2021-07-22 ENCOUNTER — Ambulatory Visit: Payer: Medicare Other | Admitting: Gastroenterology

## 2021-08-25 DIAGNOSIS — R3915 Urgency of urination: Secondary | ICD-10-CM | POA: Diagnosis not present

## 2021-08-25 DIAGNOSIS — R1084 Generalized abdominal pain: Secondary | ICD-10-CM | POA: Diagnosis not present

## 2021-08-25 DIAGNOSIS — R310 Gross hematuria: Secondary | ICD-10-CM | POA: Diagnosis not present

## 2021-09-04 ENCOUNTER — Ambulatory Visit: Payer: Medicare Other | Admitting: Gastroenterology

## 2021-09-07 ENCOUNTER — Telehealth: Payer: Self-pay

## 2021-09-07 NOTE — Telephone Encounter (Signed)
-----   Message from Thornton Park, MD sent at 09/04/2021  4:38 PM EDT ----- Discharge from the practice. She has no showed or same day cancelled her 6 last visits.  Thank you.  KLB

## 2021-09-15 ENCOUNTER — Encounter: Payer: Self-pay | Admitting: Gastroenterology

## 2021-09-21 ENCOUNTER — Telehealth: Payer: Self-pay | Admitting: Gastroenterology

## 2021-09-21 NOTE — Telephone Encounter (Signed)
Patient dismissed from [facility] by Thornton Park, MD, effective 09/15/21. Dismissal Letter sent out by 1st class mail. K[employer]

## 2021-10-29 ENCOUNTER — Emergency Department (HOSPITAL_COMMUNITY): Payer: Medicare Other

## 2021-10-29 ENCOUNTER — Emergency Department (HOSPITAL_COMMUNITY)
Admission: EM | Admit: 2021-10-29 | Discharge: 2021-10-29 | Disposition: A | Discharge status: Home / Self Care | Payer: Medicare Other | Attending: Emergency Medicine | Admitting: Emergency Medicine

## 2021-10-29 ENCOUNTER — Encounter (HOSPITAL_COMMUNITY): Payer: Self-pay | Admitting: Emergency Medicine

## 2021-10-29 ENCOUNTER — Other Ambulatory Visit: Payer: Self-pay

## 2021-10-29 DIAGNOSIS — Z79899 Other long term (current) drug therapy: Secondary | ICD-10-CM | POA: Diagnosis not present

## 2021-10-29 DIAGNOSIS — N3946 Mixed incontinence: Secondary | ICD-10-CM | POA: Diagnosis not present

## 2021-10-29 DIAGNOSIS — Z91040 Latex allergy status: Secondary | ICD-10-CM | POA: Diagnosis not present

## 2021-10-29 DIAGNOSIS — Z7982 Long term (current) use of aspirin: Secondary | ICD-10-CM | POA: Diagnosis not present

## 2021-10-29 DIAGNOSIS — W230XXA Caught, crushed, jammed, or pinched between moving objects, initial encounter: Secondary | ICD-10-CM | POA: Insufficient documentation

## 2021-10-29 DIAGNOSIS — M62838 Other muscle spasm: Secondary | ICD-10-CM | POA: Diagnosis not present

## 2021-10-29 DIAGNOSIS — S6992XA Unspecified injury of left wrist, hand and finger(s), initial encounter: Secondary | ICD-10-CM | POA: Insufficient documentation

## 2021-10-29 DIAGNOSIS — I10 Essential (primary) hypertension: Secondary | ICD-10-CM | POA: Insufficient documentation

## 2021-10-29 DIAGNOSIS — M6281 Muscle weakness (generalized): Secondary | ICD-10-CM | POA: Diagnosis not present

## 2021-10-29 DIAGNOSIS — M6289 Other specified disorders of muscle: Secondary | ICD-10-CM | POA: Diagnosis not present

## 2021-10-29 MED ORDER — ACETAMINOPHEN 500 MG PO TABS
1000.0000 mg | ORAL_TABLET | Freq: Four times a day (QID) | ORAL | Status: DC | PRN
  Administered 2021-10-29: 1000 mg via ORAL
  Filled 2021-10-29: qty 2

## 2021-10-29 NOTE — ED Triage Notes (Signed)
Pt reports jamming her left finger yesterday. Pt reports multiple swollen fingers today. Pain 10/10.

## 2021-10-29 NOTE — Discharge Instructions (Addendum)
Please return to the ED with any new or worsening signs or symptoms Please keep your finger buddy taped in position of comfort Please read attached guide concerning finger sprains Please continue to ice the finger at home.  You may apply heat after 48 hours was passed. Please continue taking Tylenol for pain at home.

## 2021-10-29 NOTE — ED Provider Notes (Signed)
[facility] DEPT Provider Note   CSN: [id number] Arrival date & time: 10/29/21  1339     History  Chief Complaint  Patient presents with   Hand Injury    Kelsey Ortega is a 61 y.o. female with extensive medical history to include PCOS, hypertension, headaches, GERD, diverticulosis, arthritis.  Patient presents to ED for evaluation of left hand injury.  Patient reports that the day prior she was trying to hang multiple items, "juggling" multiple things when the backside of her hand.  The patient reports that she attempted to catch the bag however missed and struck her left fourth ring finger on her metal thermos.  The patient reports immediate pain at this time.  Patient reports over the course the day her left fourth finger began to swell.  Patient states that is extremely tender to touch however denies that she is unable to flex and extend the finger.  The patient states that she has not taken any medications prior to arrival for this injury.  Patient denies any numbness or tingling, loss of motor function, fevers.   Hand Injury      Home Medications Prior to Admission medications   Medication Sig Start Date End Date Taking? Authorizing Provider  lubiprostone (AMITIZA) 8 MCG capsule Take 1 capsule (8 mcg total) by mouth 2 (two) times daily with a meal. Patient not taking: Reported on 05/04/2021 07/14/20   Willia Craze, NP  albuterol (VENTOLIN HFA) 108 (90 Base) MCG/ACT inhaler Inhale 1 puff into the lungs every 6 (six) hours as needed for wheezing or shortness of breath. 04/27/21   [provider]  aspirin EC 81 MG tablet Take 81 mg by mouth every 6 (six) hours as needed for moderate pain.    [provider]  B Complex-C-E-Zn (B COMPLEX-C-E-ZINC) tablet Take 1 tablet by mouth daily.    [provider]  Biotin 1000 MCG tablet Take 1,000 mcg by mouth daily.    [provider]  Calcium Carbonate-Vitamin D (CALTRATE  600+D PO) Take 1 tablet daily by mouth.    [provider]  diazepam (VALIUM) 10 MG tablet Take 10 mg every 12 (twelve) hours as needed by mouth for anxiety.    [provider]  diltiazem 2 % GEL Diltiazem 2% gel compounded with lidocaine 5 % to apply pea size amount to rectum four times a day Patient not taking: Reported on 05/04/2021 08/07/19   Thornton Park, MD  estradiol (ESTRACE) 0.1 MG/GM vaginal cream Place 1 Applicatorful vaginally once a week.    [provider]  hydroquinone 4 % cream Apply 1 application topically daily. 02/26/20   [provider]  linaclotide (LINZESS) 290 MCG CAPS capsule Take 290 mcg by mouth every other day.    [provider]  metFORMIN (GLUCOPHAGE) 1000 MG tablet Take 2,000 mg 2 (two) times daily with a meal by mouth.     [provider]  metoprolol succinate (TOPROL-XL) 50 MG 24 hr tablet TAKE 1 TABLET BY MOUTH DAILY. TAKE WITH OR IMMEDIATELY FOLLOWING A MEAL. 06/28/16   Dorena Dew, FNP  Multiple Vitamins-Minerals (WOMENS MULTIVITAMIN) TABS Take 1 tablet by mouth daily.    [provider]  polyethylene glycol (MIRALAX / GLYCOLAX) 17 g packet Take 17 g by mouth daily as needed.    [provider]  Probiotic Product (PROBIOTIC DAILY PO) Take 1 capsule by mouth daily.    [provider]  traMADol (ULTRAM) 50 MG tablet  Take 1 tablet (50 mg total) by mouth every 6 (six) hours as needed. 05/15/21 05/15/22  Marton Redwood III, MD  tretinoin (RETIN-A) 0.1 % cream Apply 1 application topically at bedtime.    [provider]  triamterene-hydrochlorothiazide (MAXZIDE-25) 37.5-25 MG tablet TAKE 1 TABLET BY MOUTH ONCE DAILY IN MORNING 06/28/16   Dorena Dew, FNP      Allergies    Augmentin [amoxicillin-pot clavulanate], Biaxin [clarithromycin], Chlordiazepoxide-amitriptyline, Oxycodone, Penicillins, Latex, and Sudafed [pseudoephedrine hcl]    Review of Systems   Review of  Systems  Musculoskeletal:  Positive for arthralgias and myalgias.  All other systems reviewed and are negative.   Physical Exam Updated Vital Signs BP (!) 141/117 (BP Location: Left Arm)   Pulse 67   Temp 97.8 F (36.6 C) (Oral)   Resp 18   LMP 12/20/2016   SpO2 97%  Physical Exam Vitals and nursing note reviewed.  Constitutional:      General: She is not in acute distress.    Appearance: Normal appearance. She is not ill-appearing, toxic-appearing or diaphoretic.  HENT:     Head: Normocephalic and atraumatic.     Nose: Nose normal. No congestion.     Mouth/Throat:     Mouth: Mucous membranes are moist.     Pharynx: Oropharynx is clear.  Eyes:     Extraocular Movements: Extraocular movements intact.     Conjunctiva/sclera: Conjunctivae normal.     Pupils: Pupils are equal, round, and reactive to light.  Cardiovascular:     Rate and Rhythm: Normal rate and regular rhythm.  Pulmonary:     Effort: Pulmonary effort is normal.     Breath sounds: Normal breath sounds. No wheezing.  Abdominal:     General: Abdomen is flat. Bowel sounds are normal.     Palpations: Abdomen is soft.     Tenderness: There is no abdominal tenderness.  Musculoskeletal:     Cervical back: Normal range of motion and neck supple. No tenderness.     Comments: Patient left fourth ring finger has soft tissue swelling proximally.  There is no erythema.  The patient has full sensation, motor function of the finger.  The patient is able to flex and extend her DIP without issue.  Skin:    General: Skin is warm and dry.     Capillary Refill: Capillary refill takes less than 2 seconds.  Neurological:     Mental Status: She is alert and oriented to person, place, and time.     ED Results / Procedures / Treatments   Labs (all labs ordered are listed, but only abnormal results are displayed) Labs Reviewed - No data to display  EKG None  Radiology DG Hand Complete Left  Result Date: 10/29/2021 CLINICAL  DATA:  Injury EXAM: LEFT HAND - COMPLETE 3+ VIEW COMPARISON:  None Available. FINDINGS: Normal alignment no fracture.  No arthropathy. IMPRESSION: Negative for fracture. Electronically Signed   By: Franchot Gallo M.D.   On: 10/29/2021 14:14    Procedures Procedures   Medications Ordered in ED Medications  acetaminophen (TYLENOL) tablet 1,000 mg (has no administration in time range)    ED Course/ Medical Decision Making/ A&P                           Medical Decision Making Amount and/or Complexity of Data Reviewed Radiology: ordered.  Risk OTC drugs.   61 year old female presents to ED for evaluation.  Please see  HPI for further details.  On examination the patient's left fourth ring finger has swelling about the soft tissues approximately.  The patient finger is neurovascularly intact, there is no erythema.  The patient has a brisk capillary refill.  Patient is able to flex and extend her DIP, she is also able to flex and extend against resistance.  I have low concern for mallet finger, tendon involvement or injury.  Plain film imaging of patient left hand shows no fracture, soft tissue swelling.  The patient will have her left fourth ring finger buddy taped in position of comfort, the patient will be provided with 1 g of Tylenol.  I have advised the patient to continue alternating ibuprofen and Tylenol at home.  I advised the patient to utilize the RICE method as well as ice her finger and then apply heat after 48 hours has passed.  The patient voices understanding of instructions.  The patient has had all of her questions answered to her satisfaction prior to discharge.  The patient is stable at this time for discharge home.  Final Clinical Impression(s) / ED Diagnoses Final diagnoses:  Injury of finger of left hand, initial encounter    Rx / DC Orders ED Discharge Orders     None         Azucena Cecil, PA-C 10/29/21 1437    Lajean Saver, MD 10/29/21  1446

## 2021-12-01 DIAGNOSIS — Z8719 Personal history of other diseases of the digestive system: Secondary | ICD-10-CM | POA: Diagnosis not present

## 2021-12-01 DIAGNOSIS — K5909 Other constipation: Secondary | ICD-10-CM | POA: Diagnosis not present

## 2021-12-01 DIAGNOSIS — R195 Other fecal abnormalities: Secondary | ICD-10-CM | POA: Diagnosis not present

## 2021-12-07 DIAGNOSIS — M545 Low back pain, unspecified: Secondary | ICD-10-CM | POA: Diagnosis not present

## 2021-12-07 DIAGNOSIS — N3000 Acute cystitis without hematuria: Secondary | ICD-10-CM | POA: Diagnosis not present

## 2021-12-17 DIAGNOSIS — N3000 Acute cystitis without hematuria: Secondary | ICD-10-CM | POA: Diagnosis not present

## 2021-12-18 DIAGNOSIS — N3000 Acute cystitis without hematuria: Secondary | ICD-10-CM | POA: Diagnosis not present

## 2021-12-21 DIAGNOSIS — N3000 Acute cystitis without hematuria: Secondary | ICD-10-CM | POA: Diagnosis not present

## 2021-12-22 DIAGNOSIS — N3000 Acute cystitis without hematuria: Secondary | ICD-10-CM | POA: Diagnosis not present

## 2021-12-23 DIAGNOSIS — N3000 Acute cystitis without hematuria: Secondary | ICD-10-CM | POA: Diagnosis not present

## 2021-12-24 DIAGNOSIS — N3000 Acute cystitis without hematuria: Secondary | ICD-10-CM | POA: Diagnosis not present

## 2021-12-25 DIAGNOSIS — N3000 Acute cystitis without hematuria: Secondary | ICD-10-CM | POA: Diagnosis not present

## 2022-01-01 DIAGNOSIS — M545 Low back pain, unspecified: Secondary | ICD-10-CM | POA: Diagnosis not present

## 2022-01-04 DIAGNOSIS — N3000 Acute cystitis without hematuria: Secondary | ICD-10-CM | POA: Diagnosis not present

## 2022-02-17 DIAGNOSIS — R1084 Generalized abdominal pain: Secondary | ICD-10-CM | POA: Diagnosis not present

## 2022-02-17 DIAGNOSIS — N952 Postmenopausal atrophic vaginitis: Secondary | ICD-10-CM | POA: Diagnosis not present

## 2022-04-08 DIAGNOSIS — J0100 Acute maxillary sinusitis, unspecified: Secondary | ICD-10-CM | POA: Diagnosis not present

## 2022-04-29 DIAGNOSIS — E118 Type 2 diabetes mellitus with unspecified complications: Secondary | ICD-10-CM | POA: Diagnosis not present

## 2022-04-29 DIAGNOSIS — I10 Essential (primary) hypertension: Secondary | ICD-10-CM | POA: Diagnosis not present

## 2022-04-29 DIAGNOSIS — F419 Anxiety disorder, unspecified: Secondary | ICD-10-CM | POA: Diagnosis not present

## 2022-04-29 DIAGNOSIS — Z23 Encounter for immunization: Secondary | ICD-10-CM | POA: Diagnosis not present

## 2022-04-29 DIAGNOSIS — K581 Irritable bowel syndrome with constipation: Secondary | ICD-10-CM | POA: Diagnosis not present

## 2022-05-03 ENCOUNTER — Other Ambulatory Visit (HOSPITAL_COMMUNITY): Payer: Self-pay | Admitting: Internal Medicine

## 2022-05-03 DIAGNOSIS — I10 Essential (primary) hypertension: Secondary | ICD-10-CM

## 2022-05-08 IMAGING — MG DIGITAL DIAGNOSTIC BILAT W/ TOMO W/ CAD
6 of 10 series · 6 of 30 positions shown · non-contrast
Comparison: 09/29/2018 and earlier

CLINICAL DATA: Patient describes diffuse pain in the LATERAL
portion of the RIGHT breast.

EXAM:
DIGITAL DIAGNOSTIC BILATERAL MAMMOGRAM WITH TOMO AND CAD

[R CC synth-2D]
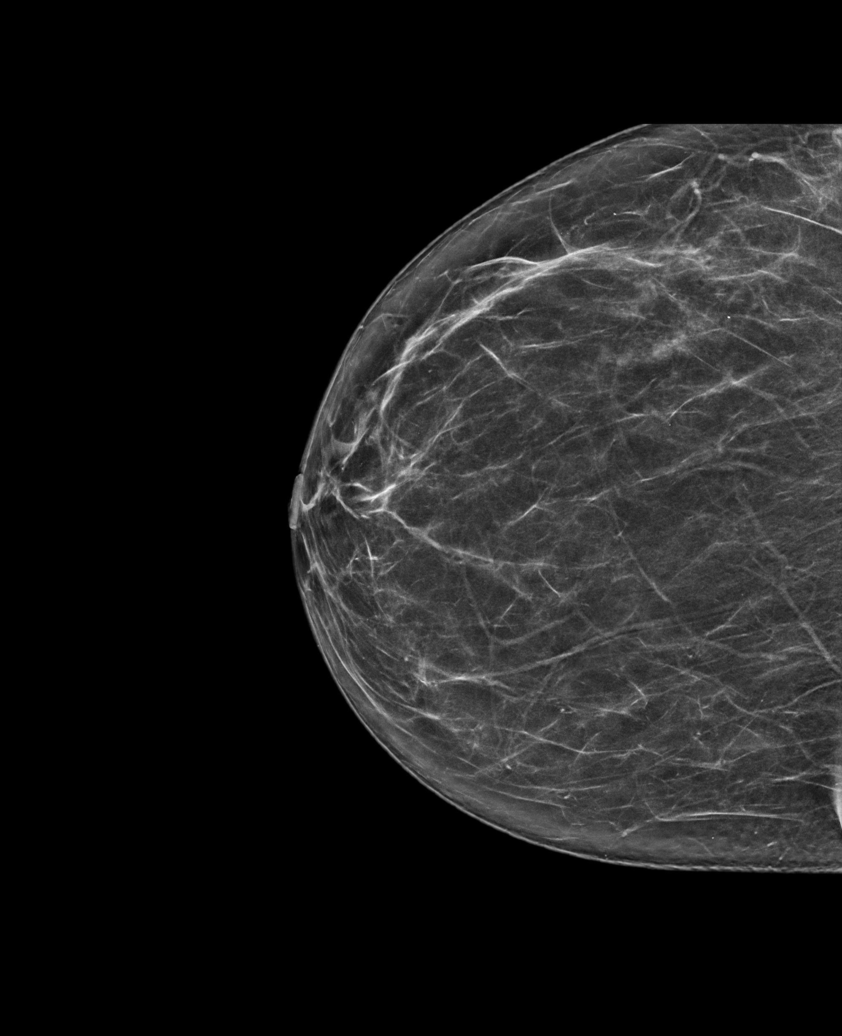

[R MLO synth-2D]
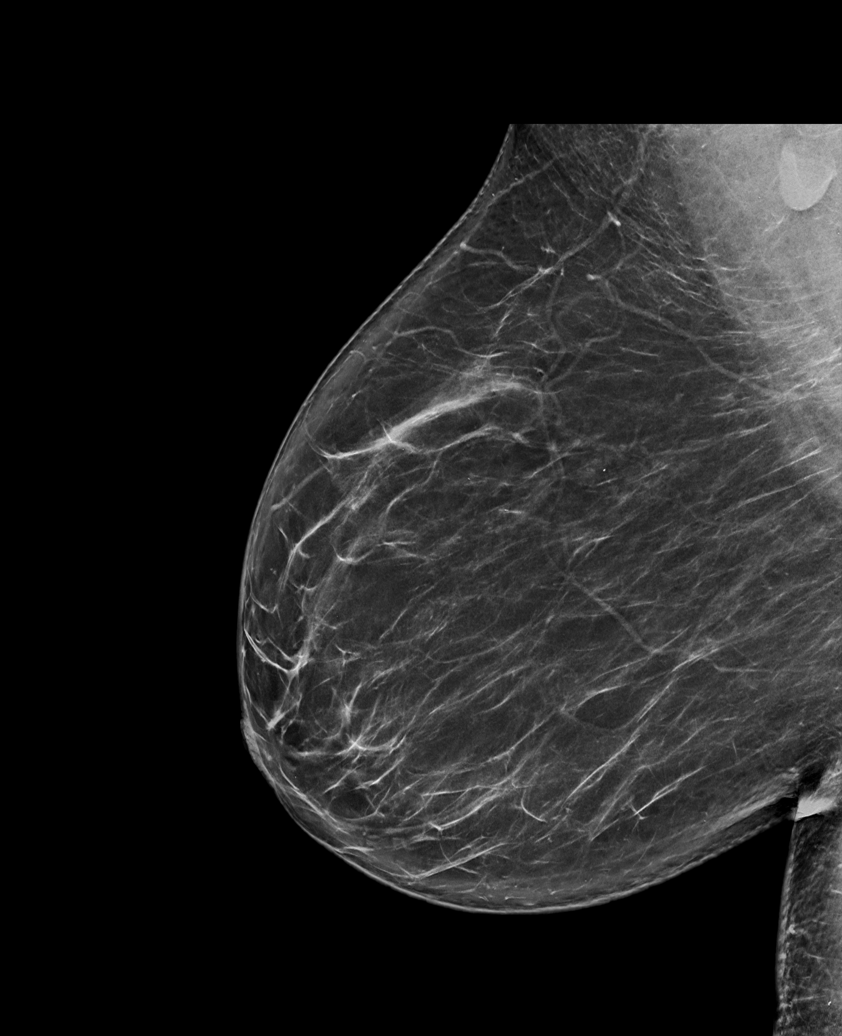

[L MLO synth-2D (1 of 2)]
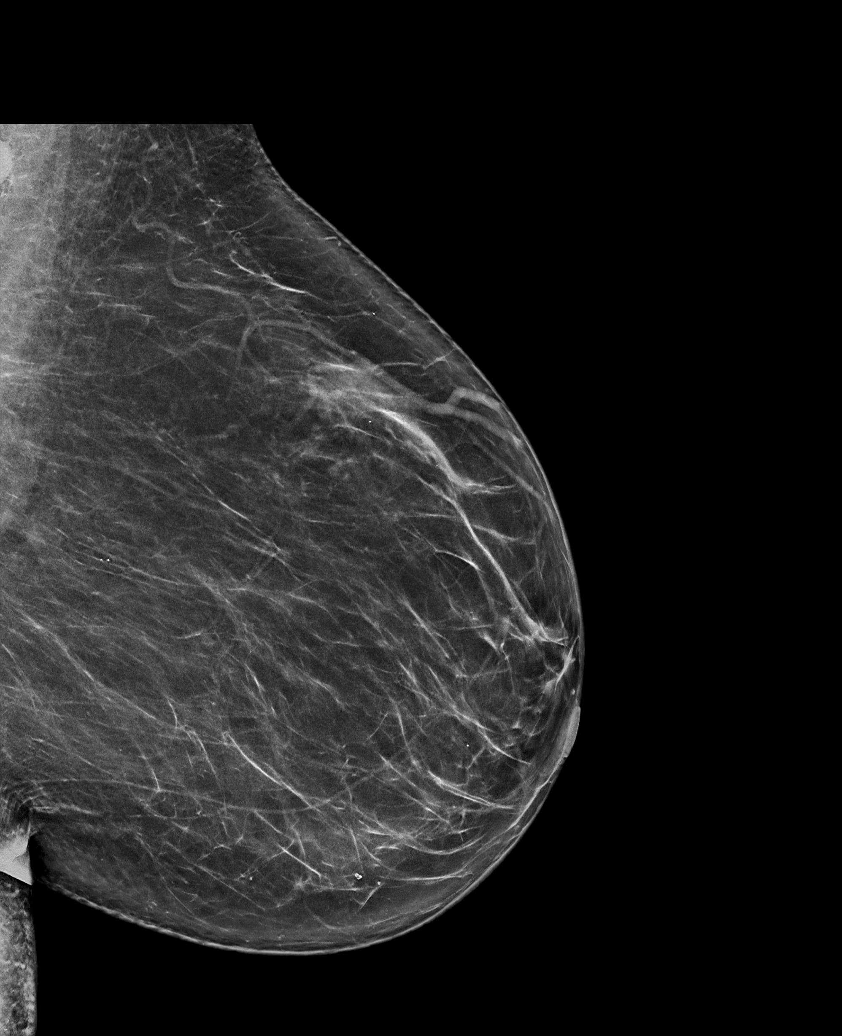

[L CC synth-2D]
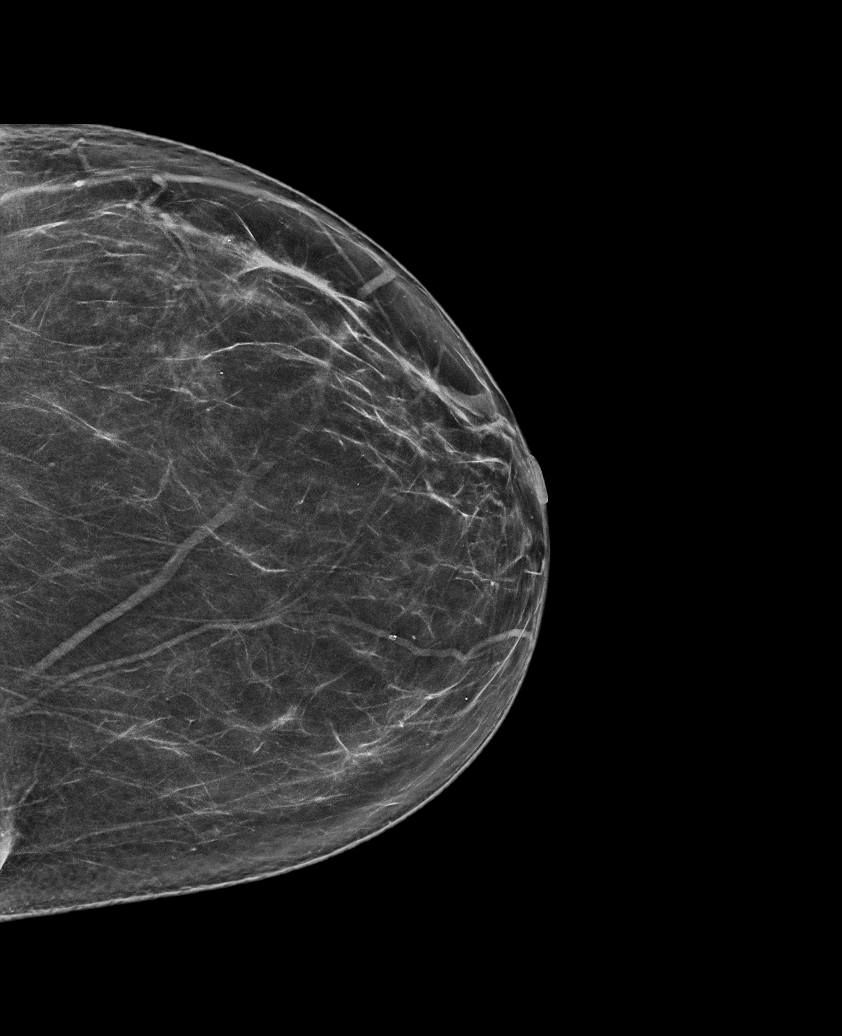

[L MLO synth-2D (2 of 2)]
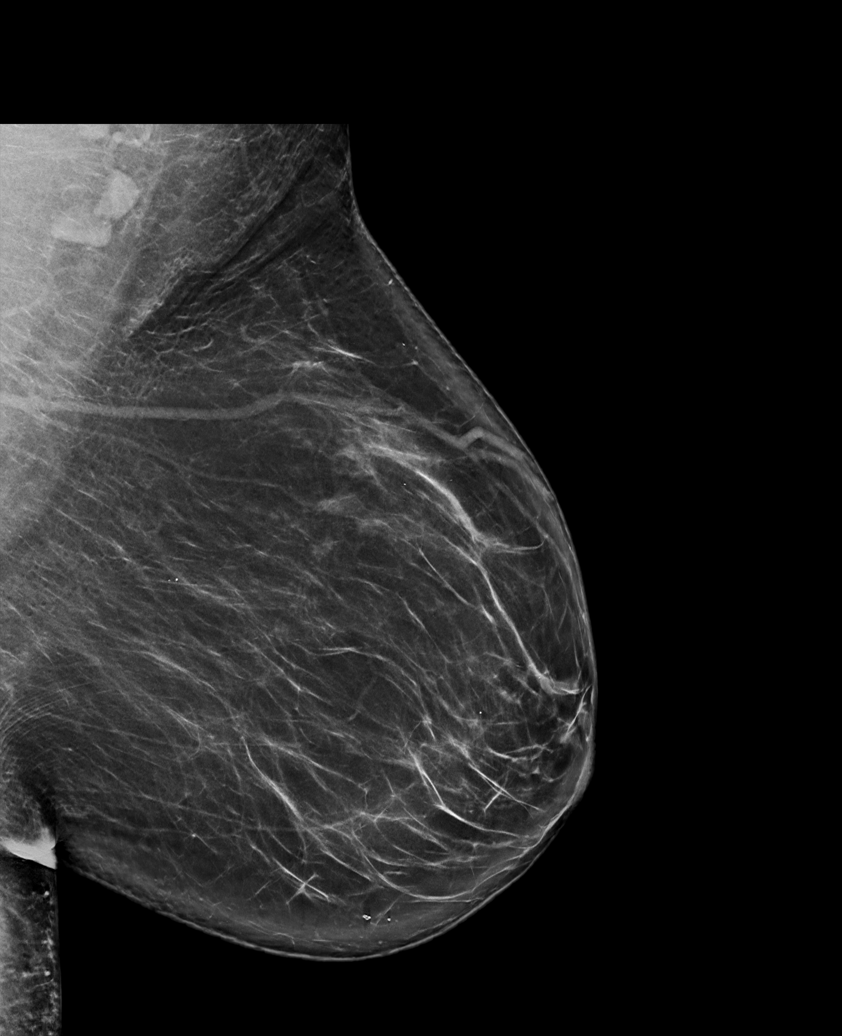

[L MLO tomo · tomo slice 41/81.0]
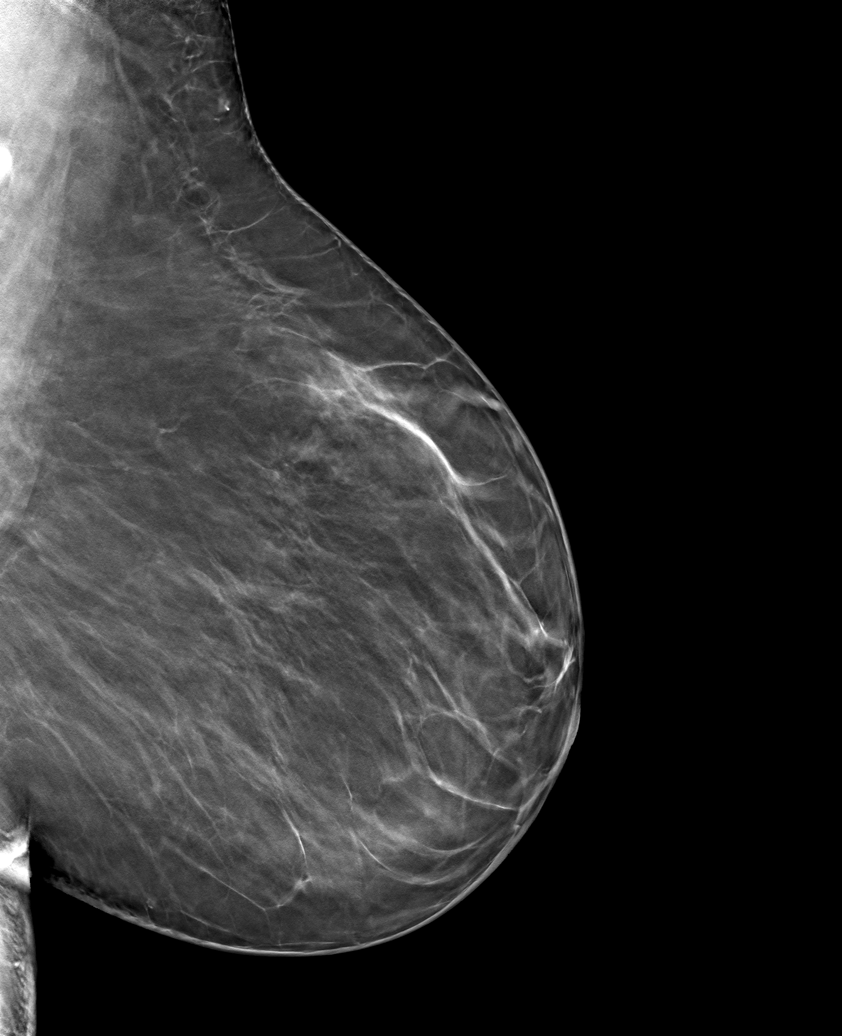

[6 of 30 positions shown; findings below may reference images not displayed]

ACR Breast Density Category b: There are scattered areas of
fibroglandular density.
FINDINGS: No suspicious mass, distortion, or microcalcifications are
identified to suggest presence of malignancy.

Mammographic images were processed with CAD.
IMPRESSION: No mammographic evidence for malignancy.

RECOMMENDATION:
Screening mammogram in one year.(Code:DH-6-YPK)

I have discussed the findings and recommendations with the patient.
If applicable, a reminder letter will be sent to the patient
regarding the next appointment.

BI-RADS CATEGORY  1: Negative.

## 2022-06-01 DIAGNOSIS — H524 Presbyopia: Secondary | ICD-10-CM | POA: Diagnosis not present

## 2022-06-09 DIAGNOSIS — Z79899 Other long term (current) drug therapy: Secondary | ICD-10-CM | POA: Diagnosis not present

## 2022-06-09 DIAGNOSIS — K648 Other hemorrhoids: Secondary | ICD-10-CM | POA: Diagnosis not present

## 2022-06-09 DIAGNOSIS — R1030 Lower abdominal pain, unspecified: Secondary | ICD-10-CM | POA: Diagnosis not present

## 2022-06-09 DIAGNOSIS — R140 Abdominal distension (gaseous): Secondary | ICD-10-CM | POA: Diagnosis not present

## 2022-06-09 DIAGNOSIS — K5904 Chronic idiopathic constipation: Secondary | ICD-10-CM | POA: Diagnosis not present

## 2022-06-14 ENCOUNTER — Ambulatory Visit (HOSPITAL_COMMUNITY): Admission: RE | Admit: 2022-06-14 | Payer: No Typology Code available for payment source | Source: Ambulatory Visit

## 2022-06-14 ENCOUNTER — Encounter (HOSPITAL_COMMUNITY): Payer: Self-pay

## 2022-10-08 DIAGNOSIS — R1032 Left lower quadrant pain: Secondary | ICD-10-CM | POA: Diagnosis not present

## 2022-10-08 DIAGNOSIS — K760 Fatty (change of) liver, not elsewhere classified: Secondary | ICD-10-CM | POA: Diagnosis not present

## 2022-10-18 DIAGNOSIS — K648 Other hemorrhoids: Secondary | ICD-10-CM | POA: Diagnosis not present

## 2022-10-18 DIAGNOSIS — K581 Irritable bowel syndrome with constipation: Secondary | ICD-10-CM | POA: Diagnosis not present

## 2022-10-18 DIAGNOSIS — Z8719 Personal history of other diseases of the digestive system: Secondary | ICD-10-CM | POA: Diagnosis not present

## 2022-10-28 DIAGNOSIS — B3731 Acute candidiasis of vulva and vagina: Secondary | ICD-10-CM | POA: Diagnosis not present

## 2022-10-28 DIAGNOSIS — K5792 Diverticulitis of intestine, part unspecified, without perforation or abscess without bleeding: Secondary | ICD-10-CM | POA: Diagnosis not present

## 2022-11-04 DIAGNOSIS — K648 Other hemorrhoids: Secondary | ICD-10-CM | POA: Diagnosis not present

## 2022-11-04 DIAGNOSIS — K635 Polyp of colon: Secondary | ICD-10-CM | POA: Diagnosis not present

## 2022-11-04 DIAGNOSIS — Z1211 Encounter for screening for malignant neoplasm of colon: Secondary | ICD-10-CM | POA: Diagnosis not present

## 2022-11-20 DIAGNOSIS — K635 Polyp of colon: Secondary | ICD-10-CM | POA: Diagnosis not present

## 2023-03-31 DIAGNOSIS — R350 Frequency of micturition: Secondary | ICD-10-CM | POA: Diagnosis not present

## 2023-03-31 DIAGNOSIS — K581 Irritable bowel syndrome with constipation: Secondary | ICD-10-CM | POA: Diagnosis not present

## 2023-03-31 DIAGNOSIS — R1084 Generalized abdominal pain: Secondary | ICD-10-CM | POA: Diagnosis not present

## 2023-03-31 DIAGNOSIS — R3914 Feeling of incomplete bladder emptying: Secondary | ICD-10-CM | POA: Diagnosis not present

## 2023-03-31 DIAGNOSIS — Z8719 Personal history of other diseases of the digestive system: Secondary | ICD-10-CM | POA: Diagnosis not present

## 2023-03-31 DIAGNOSIS — R3915 Urgency of urination: Secondary | ICD-10-CM | POA: Diagnosis not present

## 2023-03-31 DIAGNOSIS — K648 Other hemorrhoids: Secondary | ICD-10-CM | POA: Diagnosis not present

## 2023-03-31 DIAGNOSIS — E6609 Other obesity due to excess calories: Secondary | ICD-10-CM | POA: Diagnosis not present

## 2023-03-31 DIAGNOSIS — R7303 Prediabetes: Secondary | ICD-10-CM | POA: Diagnosis not present

## 2024-01-05 DIAGNOSIS — L739 Follicular disorder, unspecified: Secondary | ICD-10-CM | POA: Diagnosis not present

## 2024-01-05 DIAGNOSIS — D2361 Other benign neoplasm of skin of right upper limb, including shoulder: Secondary | ICD-10-CM | POA: Diagnosis not present

## 2024-01-05 DIAGNOSIS — L811 Chloasma: Secondary | ICD-10-CM | POA: Diagnosis not present

## 2024-01-05 DIAGNOSIS — L304 Erythema intertrigo: Secondary | ICD-10-CM | POA: Diagnosis not present

## 2024-01-12 DIAGNOSIS — R739 Hyperglycemia, unspecified: Secondary | ICD-10-CM | POA: Diagnosis not present

## 2024-01-12 DIAGNOSIS — I10 Essential (primary) hypertension: Secondary | ICD-10-CM | POA: Diagnosis not present

## 2024-01-17 DIAGNOSIS — I10 Essential (primary) hypertension: Secondary | ICD-10-CM | POA: Diagnosis not present

## 2024-01-17 DIAGNOSIS — G720 Drug-induced myopathy: Secondary | ICD-10-CM | POA: Diagnosis not present

## 2024-01-17 DIAGNOSIS — J4520 Mild intermittent asthma, uncomplicated: Secondary | ICD-10-CM | POA: Diagnosis not present

## 2024-01-17 DIAGNOSIS — E118 Type 2 diabetes mellitus with unspecified complications: Secondary | ICD-10-CM | POA: Diagnosis not present

## 2024-01-17 DIAGNOSIS — Z0000 Encounter for general adult medical examination without abnormal findings: Secondary | ICD-10-CM | POA: Diagnosis not present

## 2024-01-18 ENCOUNTER — Other Ambulatory Visit: Payer: Self-pay | Admitting: Internal Medicine

## 2024-01-18 DIAGNOSIS — I10 Essential (primary) hypertension: Secondary | ICD-10-CM
